# Patient Record
Sex: Male | Born: 1985 | Race: Black or African American | Hispanic: No | Marital: Single | Smoking: Current every day smoker
Health system: Southern US, Community
[De-identification: ages and names within clinical notes are randomized; demographics above are authoritative.]

## PROBLEM LIST (undated history)

## (undated) DIAGNOSIS — F410 Panic disorder [episodic paroxysmal anxiety] without agoraphobia: Secondary | ICD-10-CM

## (undated) DIAGNOSIS — F1410 Cocaine abuse, uncomplicated: Secondary | ICD-10-CM

## (undated) HISTORY — PX: REFRACTIVE SURGERY: SHX103

---
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
## 2006-11-15 ENCOUNTER — Emergency Department: Payer: Self-pay | Admitting: Emergency Medicine

## 2007-01-25 ENCOUNTER — Emergency Department: Payer: Self-pay | Admitting: Emergency Medicine

## 2007-01-31 ENCOUNTER — Emergency Department: Payer: Self-pay | Admitting: Emergency Medicine

## 2007-03-12 ENCOUNTER — Emergency Department: Payer: Self-pay | Admitting: Emergency Medicine

## 2007-10-07 ENCOUNTER — Emergency Department: Payer: Self-pay | Admitting: Emergency Medicine

## 2007-11-05 ENCOUNTER — Inpatient Hospital Stay: Payer: Self-pay | Admitting: Psychiatry

## 2008-03-23 ENCOUNTER — Emergency Department: Payer: Self-pay | Admitting: Internal Medicine

## 2009-11-27 IMAGING — CR LEFT WRIST - COMPLETE 3+ VIEW
1 series · 4 of 4 positions shown · non-contrast
Comparison: none

REASON FOR EXAM: trauma
COMMENTS:

PROCEDURE:     DXR - DXR WRIST LT COMP WITH OBLIQUES  - March 23, 2008 [DATE]
RESULT:     Four views of the LEFT wrist reveal the bones to be adequately
mineralized. There is no evidence of fracture nor of dislocation.

[Series 1: view not recorded · 0.17mm/px · 4 of 4 slices shown]
[im 1/4]
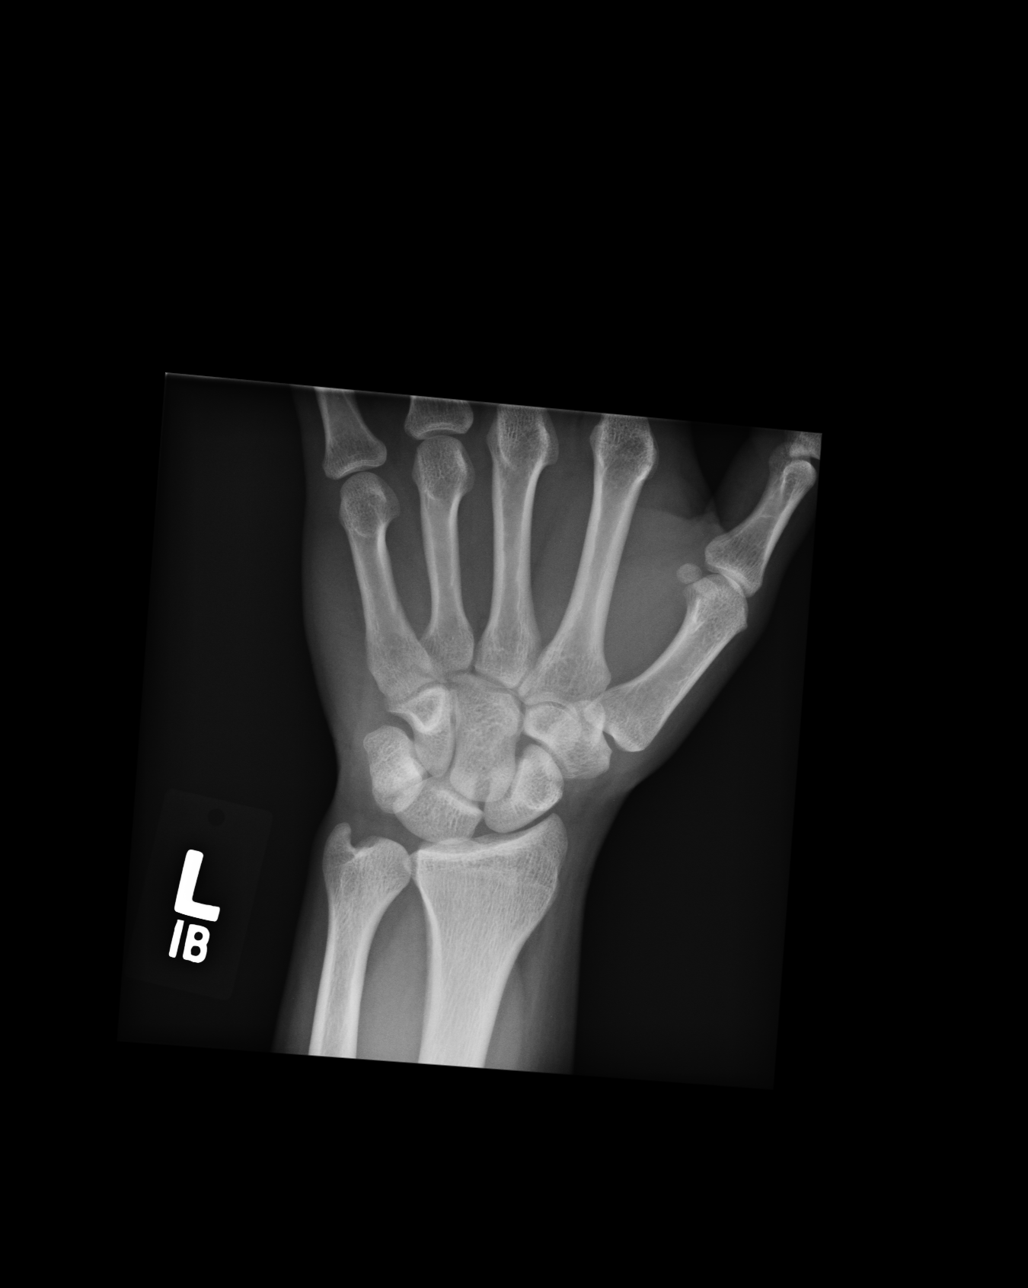
[im 2/4]
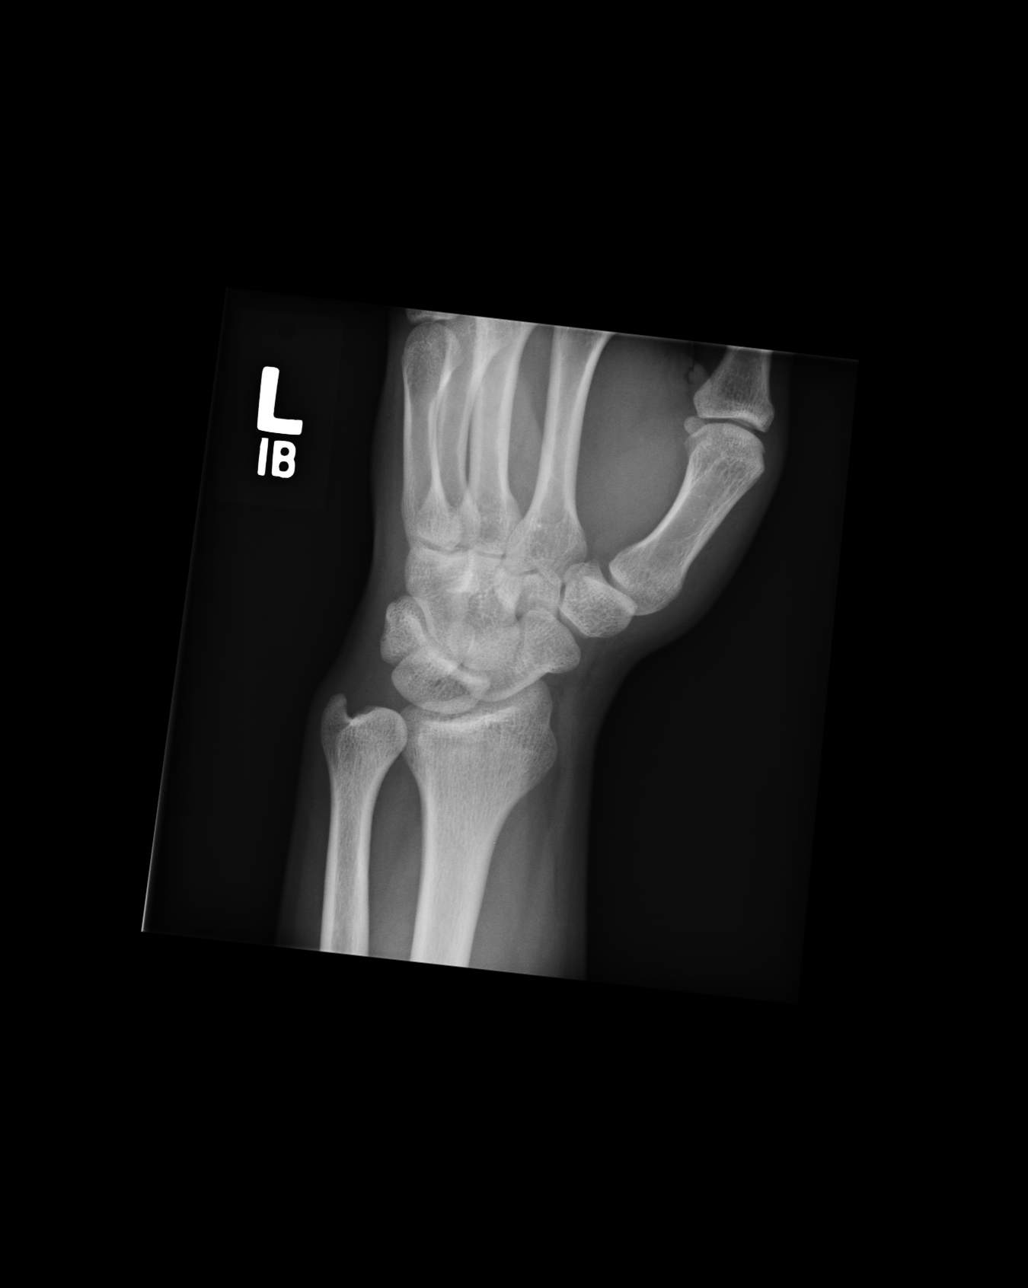
[im 3/4]
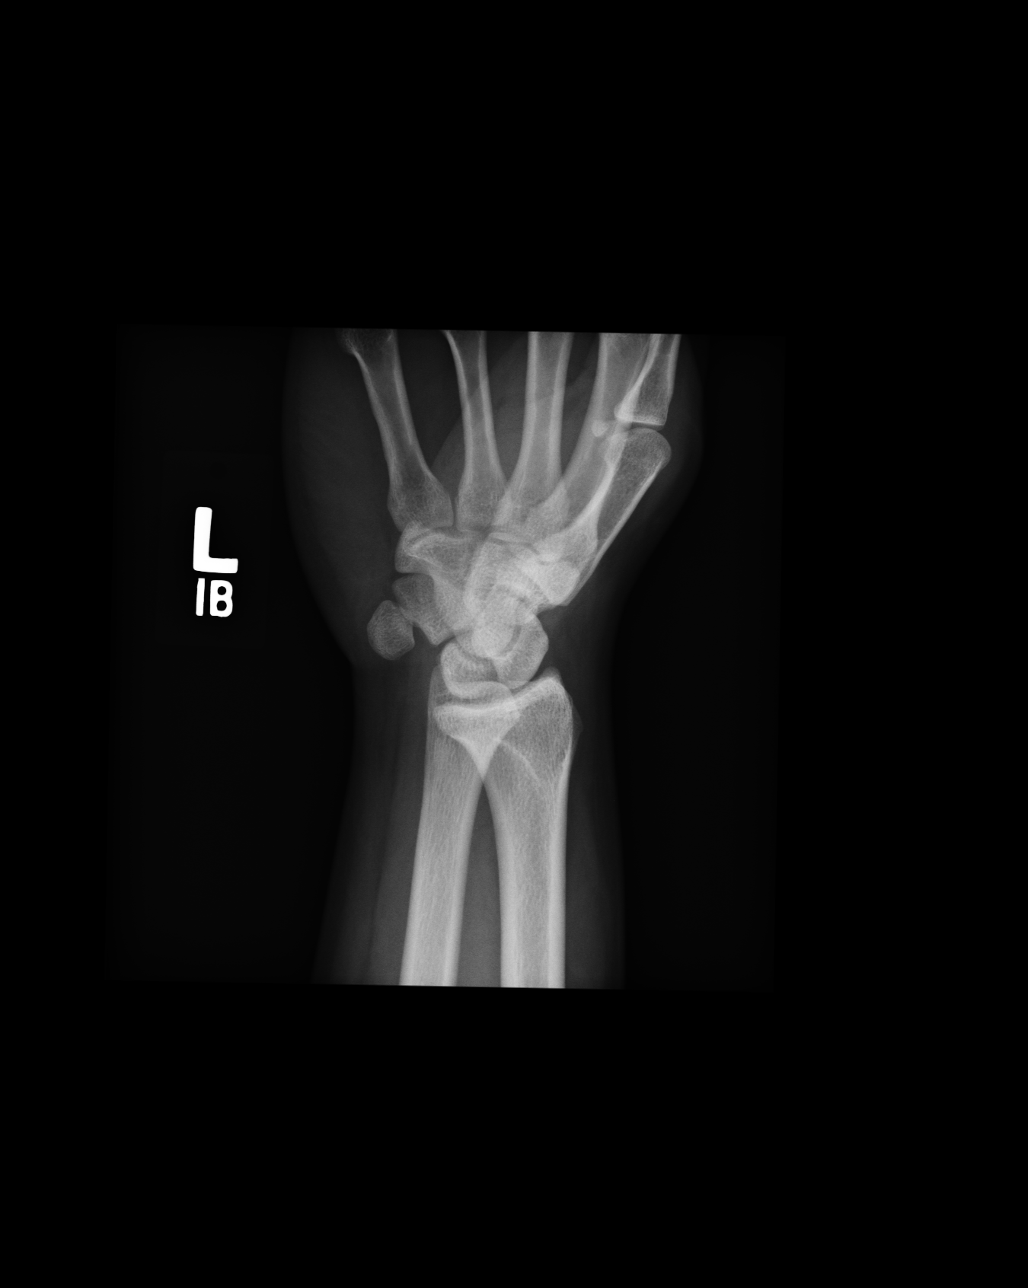
[im 4/4]
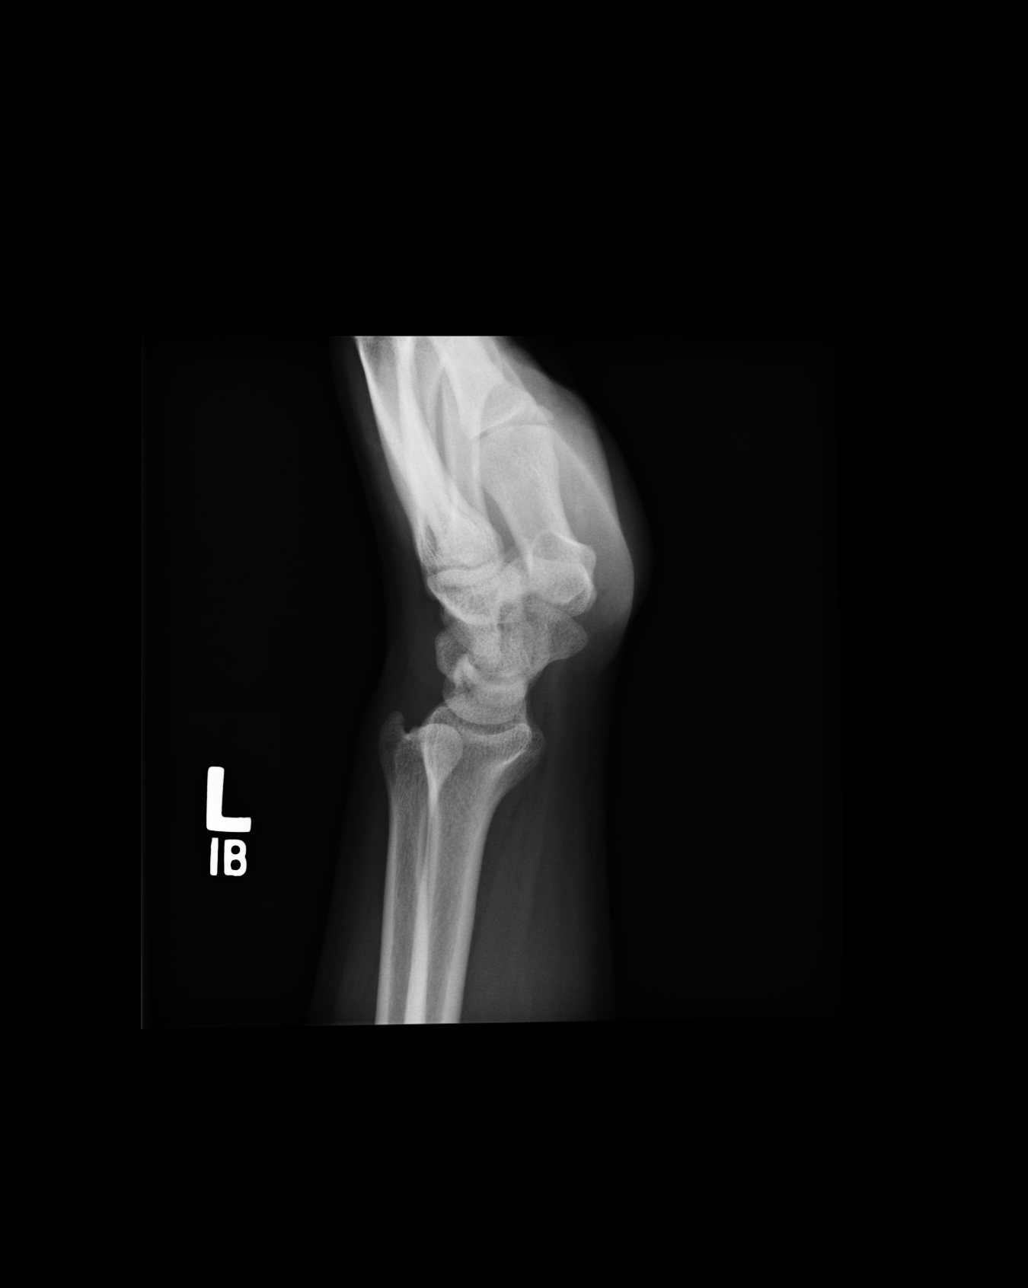

[4 of 4 positions shown; findings below may reference images not displayed]

IMPRESSION: I do not see evidence of acute bony abnormality of the LEFT wrist. Followup
imaging is available if the patient's symptoms warrant further evaluation.

## 2010-06-18 ENCOUNTER — Emergency Department: Payer: Self-pay | Admitting: Emergency Medicine

## 2011-03-19 ENCOUNTER — Emergency Department: Payer: Self-pay | Admitting: Emergency Medicine

## 2012-01-19 ENCOUNTER — Emergency Department: Payer: Self-pay | Admitting: Emergency Medicine

## 2012-01-19 LAB — BASIC METABOLIC PANEL
BUN: 11 mg/dL (ref 7–18)
Calcium, Total: 9.4 mg/dL (ref 8.5–10.1)
Co2: 27 mmol/L (ref 21–32)
Creatinine: 1.16 mg/dL (ref 0.60–1.30)
EGFR (African American): >60
EGFR (Non-African Amer.): >60
Potassium: 4 mmol/L (ref 3.5–5.1)
Sodium: 140 mmol/L (ref 136–145)

## 2012-01-19 LAB — CK: CK, Total: 657 U/L — ABNORMAL HIGH (ref 35–232)

## 2013-02-25 ENCOUNTER — Emergency Department: Payer: Self-pay | Admitting: Emergency Medicine

## 2013-09-08 ENCOUNTER — Emergency Department: Payer: Self-pay | Admitting: Emergency Medicine

## 2013-10-09 ENCOUNTER — Emergency Department: Payer: Self-pay | Admitting: Emergency Medicine

## 2014-05-17 ENCOUNTER — Emergency Department: Payer: Self-pay | Admitting: Emergency Medicine

## 2014-12-23 ENCOUNTER — Emergency Department
Admission: EM | Admit: 2014-12-23 | Discharge: 2014-12-23 | Disposition: A | Discharge status: Home / Self Care | Payer: Medicaid Other | Attending: Emergency Medicine | Admitting: Emergency Medicine

## 2014-12-23 ENCOUNTER — Emergency Department: Payer: Medicaid Other

## 2014-12-23 ENCOUNTER — Encounter: Payer: Self-pay | Admitting: Emergency Medicine

## 2014-12-23 DIAGNOSIS — H66004 Acute suppurative otitis media without spontaneous rupture of ear drum, recurrent, right ear: Secondary | ICD-10-CM | POA: Diagnosis not present

## 2014-12-23 DIAGNOSIS — R079 Chest pain, unspecified: Secondary | ICD-10-CM | POA: Diagnosis not present

## 2014-12-23 DIAGNOSIS — Z720 Tobacco use: Secondary | ICD-10-CM | POA: Diagnosis not present

## 2014-12-23 DIAGNOSIS — H9201 Otalgia, right ear: Secondary | ICD-10-CM | POA: Diagnosis present

## 2014-12-23 LAB — CBC
HCT: 45.4 % (ref 40.0–52.0)
HEMOGLOBIN: 14.8 g/dL (ref 13.0–18.0)
MCH: 28.6 pg (ref 26.0–34.0)
MCHC: 32.6 g/dL (ref 32.0–36.0)
MCV: 87.6 fL (ref 80.0–100.0)
Platelets: 233 10*3/uL (ref 150–440)
RBC: 5.18 MIL/uL (ref 4.40–5.90)
RDW: 14 % (ref 11.5–14.5)
WBC: 8.3 10*3/uL (ref 3.8–10.6)

## 2014-12-23 LAB — TROPONIN I

## 2014-12-23 LAB — BASIC METABOLIC PANEL
ANION GAP: 8 (ref 5–15)
BUN: 12 mg/dL (ref 6–20)
CALCIUM: 9.9 mg/dL (ref 8.9–10.3)
CO2: 26 mmol/L (ref 22–32)
CREATININE: 1.29 mg/dL — AB (ref 0.61–1.24)
Chloride: 106 mmol/L (ref 101–111)
GFR calc Af Amer: >60 mL/min (ref >60)
GFR calc non Af Amer: >60 mL/min (ref >60)
GLUCOSE: 119 mg/dL — AB (ref 65–99)
Potassium: 3.4 mmol/L — ABNORMAL LOW (ref 3.5–5.1)
Sodium: 140 mmol/L (ref 135–145)

## 2014-12-23 MED ORDER — IBUPROFEN 600 MG PO TABS
ORAL_TABLET | ORAL | Status: AC
Start: 2014-12-23 — End: 2014-12-23
  Administered 2014-12-23: 600 mg via ORAL
  Filled 2014-12-23: qty 1

## 2014-12-23 MED ORDER — IBUPROFEN 200 MG PO TABS: 600.0000 mg | ORAL_TABLET | Freq: Four times a day (QID) | ORAL | Status: DC | PRN

## 2014-12-23 MED ORDER — AZITHROMYCIN 250 MG PO TABS: ORAL_TABLET | ORAL | Status: DC

## 2014-12-23 MED ORDER — IBUPROFEN 400 MG PO TABS
600.0000 mg | ORAL_TABLET | Freq: Once | ORAL | Status: AC
  Administered 2014-12-23: 600 mg via ORAL

## 2014-12-23 MED ORDER — DEXAMETHASONE 6 MG PO TABS
6.0000 mg | ORAL_TABLET | Freq: Once | ORAL | Status: AC
  Administered 2014-12-23: 6 mg via ORAL
  Filled 2014-12-23: qty 1

## 2014-12-23 NOTE — ED Provider Notes (Signed)
[facility] Emergency Department Provider Note  ____________________________________________  Time seen: 6:10 PM  I have reviewed the triage vital signs and the nursing notes.   HISTORY  Chief Complaint Chest Pain and Otalgia    HPI Bryan Walters is a 29 y.o. male who complains of right ear pain and decreased hearing for the past 3 days. Gradual onset and constant. He notes that this happens about once a month and lasts about 5 days. It is associated with pain in the right ear. He also has some sore throat. Denies any swollen glands. No shortness of breath or cough. He has had some runny nose as well. He reported some chest pain in the waiting room, which is now resolved. Was not exertional nor pleuritic. Not radiating or associated with nausea vomiting diaphoresis or shortness of breath.     History reviewed. No pertinent past medical history.   There are no active problems to display for this patient.    History reviewed. No pertinent past surgical history.   Current Outpatient Rx  Name  Route  Sig  Dispense  Refill  . azithromycin (ZITHROMAX Z-PAK) 250 MG tablet      Take 2 tablets (500 mg) on  Day 1,  followed by 1 tablet (250 mg) once daily on Days 2 through 5.   6 each   0   . ibuprofen (MOTRIN IB) 200 MG tablet   Oral   Take 3 tablets (600 mg total) by mouth every 6 (six) hours as needed.   60 tablet   0      Allergies Review of patient's allergies indicates no known allergies.   History reviewed. No pertinent family history.  Social History Social History  Substance Use Topics  . Smoking status: Current Every Day Smoker  . Smokeless tobacco: None  . Alcohol Use: No    Review of Systems  Constitutional:   No fever or chills. No weight changes Eyes:   No blurry vision or double vision.  ENT:   No sore throat.positive right earache and decreased hearing on the right Cardiovascular:   Chest pain as above  briefly Respiratory:   No dyspnea or cough. Gastrointestinal:   Negative for abdominal pain, vomiting and diarrhea.  No BRBPR or melena. Genitourinary:   Negative for dysuria, urinary retention, bloody urine, or difficulty urinating. Musculoskeletal:   Negative for back pain. No joint swelling or pain. Skin:   Negative for rash. Neurological:   Negative for headaches, focal weakness or numbness. Psychiatric:  No anxiety or depression.   Endocrine:  No hot/cold intolerance, changes in energy, or sleep difficulty.  10-point ROS otherwise negative.  ____________________________________________   PHYSICAL EXAM:  VITAL SIGNS: ED Triage Vitals  Enc Vitals Group     BP 12/23/14 1404 122/81 mmHg     Pulse Rate 12/23/14 1404 61     Resp 12/23/14 1404 22     Temp 12/23/14 1404 99.1 F (37.3 C)     Temp Source 12/23/14 1404 Oral     SpO2 12/23/14 1404 99 %     Weight 12/23/14 1404 185 lb (83.915 kg)     Height 12/23/14 1404  (1.676 m)     Head Cir --      Peak Flow --      Pain Score 12/23/14 1412 8     Pain Loc --      Pain Edu? --      Excl. in GC? --  Constitutional:   Alert and oriented. Well appearing and in no distress. Eyes:   No scleral icterus. No conjunctival pallor. PERRL. EOMI ENT   Head:   Normocephalic and atraumatic.left TM normal. Right TM inflamed.no mastoid tenderness   Nose:   Positive congestion. No septal hematoma   Mouth/Throat:   MMM, moderatepharyngeal erythema. No peritonsillar mass. No uvula shift.normal voice, managing secretions   Neck:   No stridor. No SubQ emphysema. No meningismus. Hematological/Lymphatic/Immunilogical:   No cervical lymphadenopathy. Cardiovascular:   RRR. Normal and symmetric distal pulses are present in all extremities. No murmurs, rubs, or gallops. Respiratory:   Normal respiratory effort without tachypnea nor retractions. Breath sounds are clear and equal bilaterally. No  wheezes/rales/rhonchi. Gastrointestinal:   Soft and nontender. No distention. There is no CVA tenderness.  No rebound, rigidity, or guarding. Genitourinary:   deferred Musculoskeletal:   Nontender with normal range of motion in all extremities. No joint effusions.  No lower extremity tenderness.  No edema. Neurologic:   Normal speech and language.  CN 2-10 normal. Motor grossly intact. No pronator drift.  Normal gait. No gross focal neurologic deficits are appreciated.  Skin:    Skin is warm, dry and intact. No rash noted.  No petechiae, purpura, or bullae. Psychiatric:   Mood and affect are normal. Speech and behavior are normal. Patient exhibits appropriate insight and judgment.  ____________________________________________    LABS (pertinent positives/negatives) (all labs ordered are listed, but only abnormal results are displayed) Labs Reviewed  BASIC METABOLIC PANEL - Abnormal; Notable for the following:    Potassium 3.4 (*)    Glucose, Bld 119 (*)    Creatinine, Ser 1.29 (*)    All other components within normal limits  CBC  TROPONIN I   ____________________________________________   EKG  Interpreted by me Normal sinus rhythm rate of 64, normal axis and intervals. Incomplete right bundle branch block. Normal ST segments and T waves.  ____________________________________________    RADIOLOGY  Chest x-ray unremarkable  ____________________________________________   PROCEDURES   ____________________________________________   INITIAL IMPRESSION / ASSESSMENT AND PLAN / ED COURSE  Pertinent labs & imaging results that were available during my care of the patient were reviewed by me and considered in my medical decision making (see chart for details).  Labs unremarkable. Patient presents with a right otitis media without any other concerning features. The chest pain he experiences atypical and he feels it was likely related to anxiety due to his worsening ear  pain. Low suspicion for any intracranial abscess or CNS involvement. It is not malignant or involving the soft tissues or the orbit. We'll have him take a trial of Decadron and NSAIDs, and if the symptoms do not improve in the next 3 days we'll have him start taking azithromycin.     ____________________________________________   FINAL CLINICAL IMPRESSION(S) / ED DIAGNOSES  Final diagnoses:  Recurrent acute suppurative otitis media of right ear without spontaneous rupture of tympanic membrane      Sharman Cheek, MD 12/23/14 1819

## 2014-12-23 NOTE — Discharge Instructions (Signed)
You were seen in the ER today for an ear infection. We gave you a steroid medication to help with this. Take ibuprofen every 6 hours over the next few days as well. You do not need to fill the prescription for azithromycin right away. However, if your symptoms have not improved 3 days from now, you should fill the prescription and start taking azithromycin as directed at that time.  Otitis Media Otitis media is redness, soreness, and inflammation of the middle ear. Otitis media may be caused by allergies or, most commonly, by infection. Often it occurs as a complication of the common cold. SIGNS AND SYMPTOMS Symptoms of otitis media may include:  Earache.  Fever.  Ringing in your ear.  Headache.  Leakage of fluid from the ear. DIAGNOSIS To diagnose otitis media, your health care provider will examine your ear with an otoscope. This is an instrument that allows your health care provider to see into your ear in order to examine your eardrum. Your health care provider also will ask you questions about your symptoms. TREATMENT  Typically, otitis media resolves on its own within 3-5 days. Your health care provider may prescribe medicine to ease your symptoms of pain. If otitis media does not resolve within 5 days or is recurrent, your health care provider may prescribe antibiotic medicines if he or she suspects that a bacterial infection is the cause. HOME CARE INSTRUCTIONS   If you were prescribed an antibiotic medicine, finish it all even if you start to feel better.  Take medicines only as directed by your health care provider.  Keep all follow-up visits as directed by your health care provider. SEEK MEDICAL CARE IF:  You have otitis media only in one ear, or bleeding from your nose, or both.  You notice a lump on your neck.  You are not getting better in 3-5 days.  You feel worse instead of better. SEEK IMMEDIATE MEDICAL CARE IF:   You have pain that is not controlled with  medicine.  You have swelling, redness, or pain around your ear or stiffness in your neck.  You notice that part of your face is paralyzed.  You notice that the bone behind your ear (mastoid) is tender when you touch it. MAKE SURE YOU:   Understand these instructions.  Will watch your condition.  Will get help right away if you are not doing well or get worse. Document Released: 12/30/2003 Document Revised: 08/10/2013 Document Reviewed: 10/21/2012 [facility] Patient Information 2015 [place], [place]. This information is not intended to replace advice given to you by your health care provider. Make sure you discuss any questions you have with your health care provider.

## 2014-12-23 NOTE — ED Notes (Signed)
Pt states ear pain and fullness for 2 days, states his chest feels tight but believes its from the anxiety of not being able to hear well, states some nasal drainage and cough, denies any fevers

## 2014-12-23 NOTE — ED Notes (Signed)
Pt to ed with c/o chest pain this am.  Pt states sharp and shooting pain in chest with right side of head pain and earache this am.  Denies sob, does report cough and congestion, appears in nad at triage at this time.

## 2015-05-15 IMAGING — CR DG SHOULDER 3+V*R*
1 series · 3 of 3 positions shown · non-contrast
Comparison: None.

CLINICAL DATA: Right shoulder pain following injury

EXAM:
DG SHOULDER 3+ VIEWS RIGHT

[Series 1: internal rotate · 0.17mm/px · 3 of 3 slices shown]
[im 1/3]
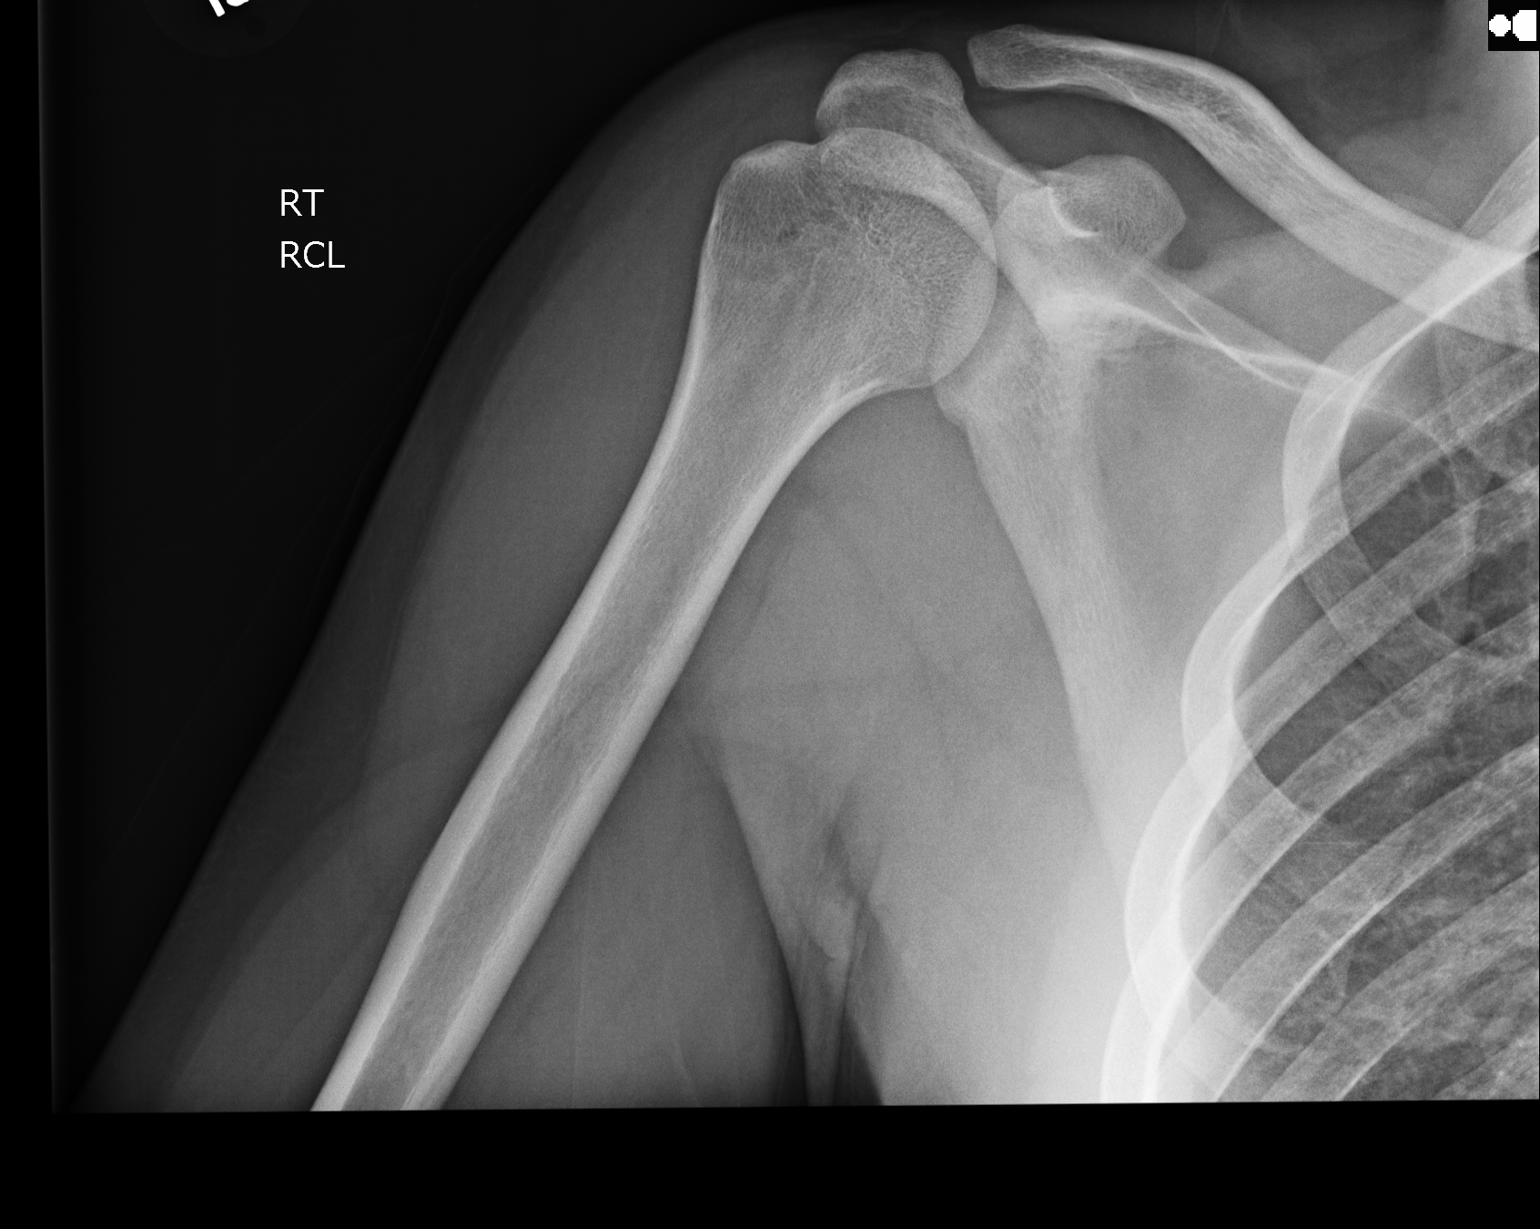
[im 2/3]
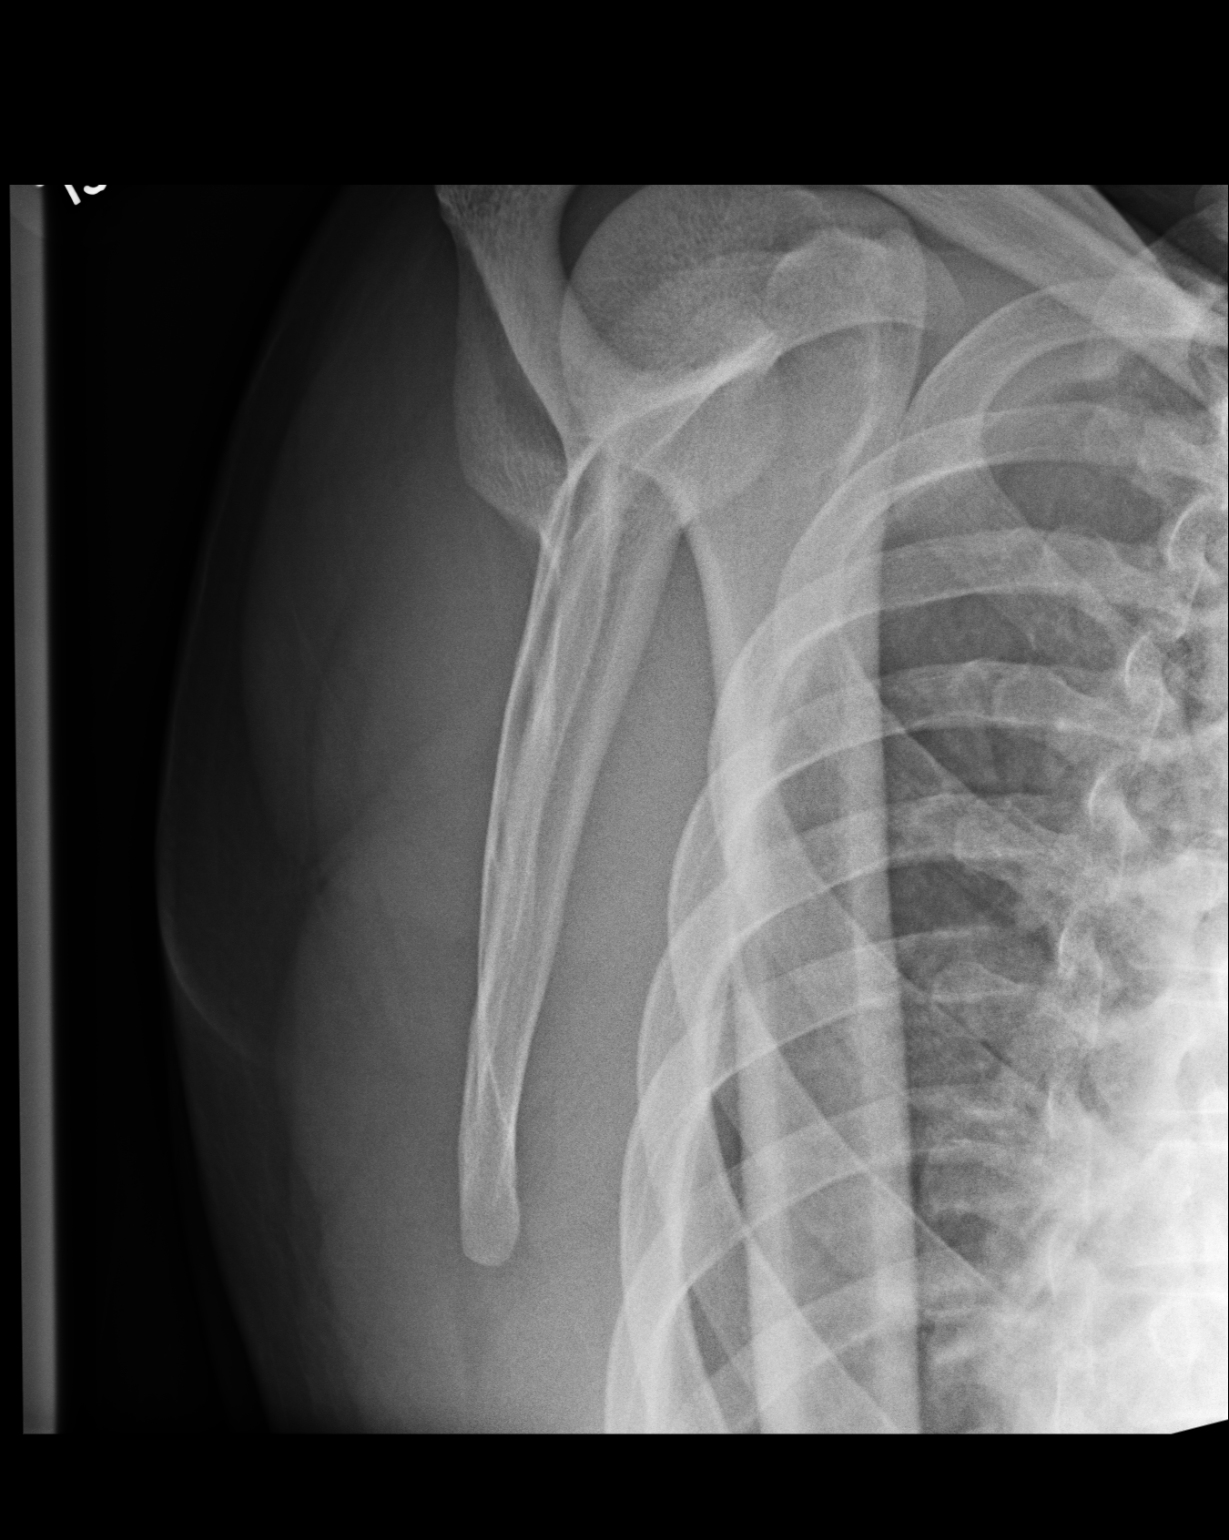
[im 3/3]
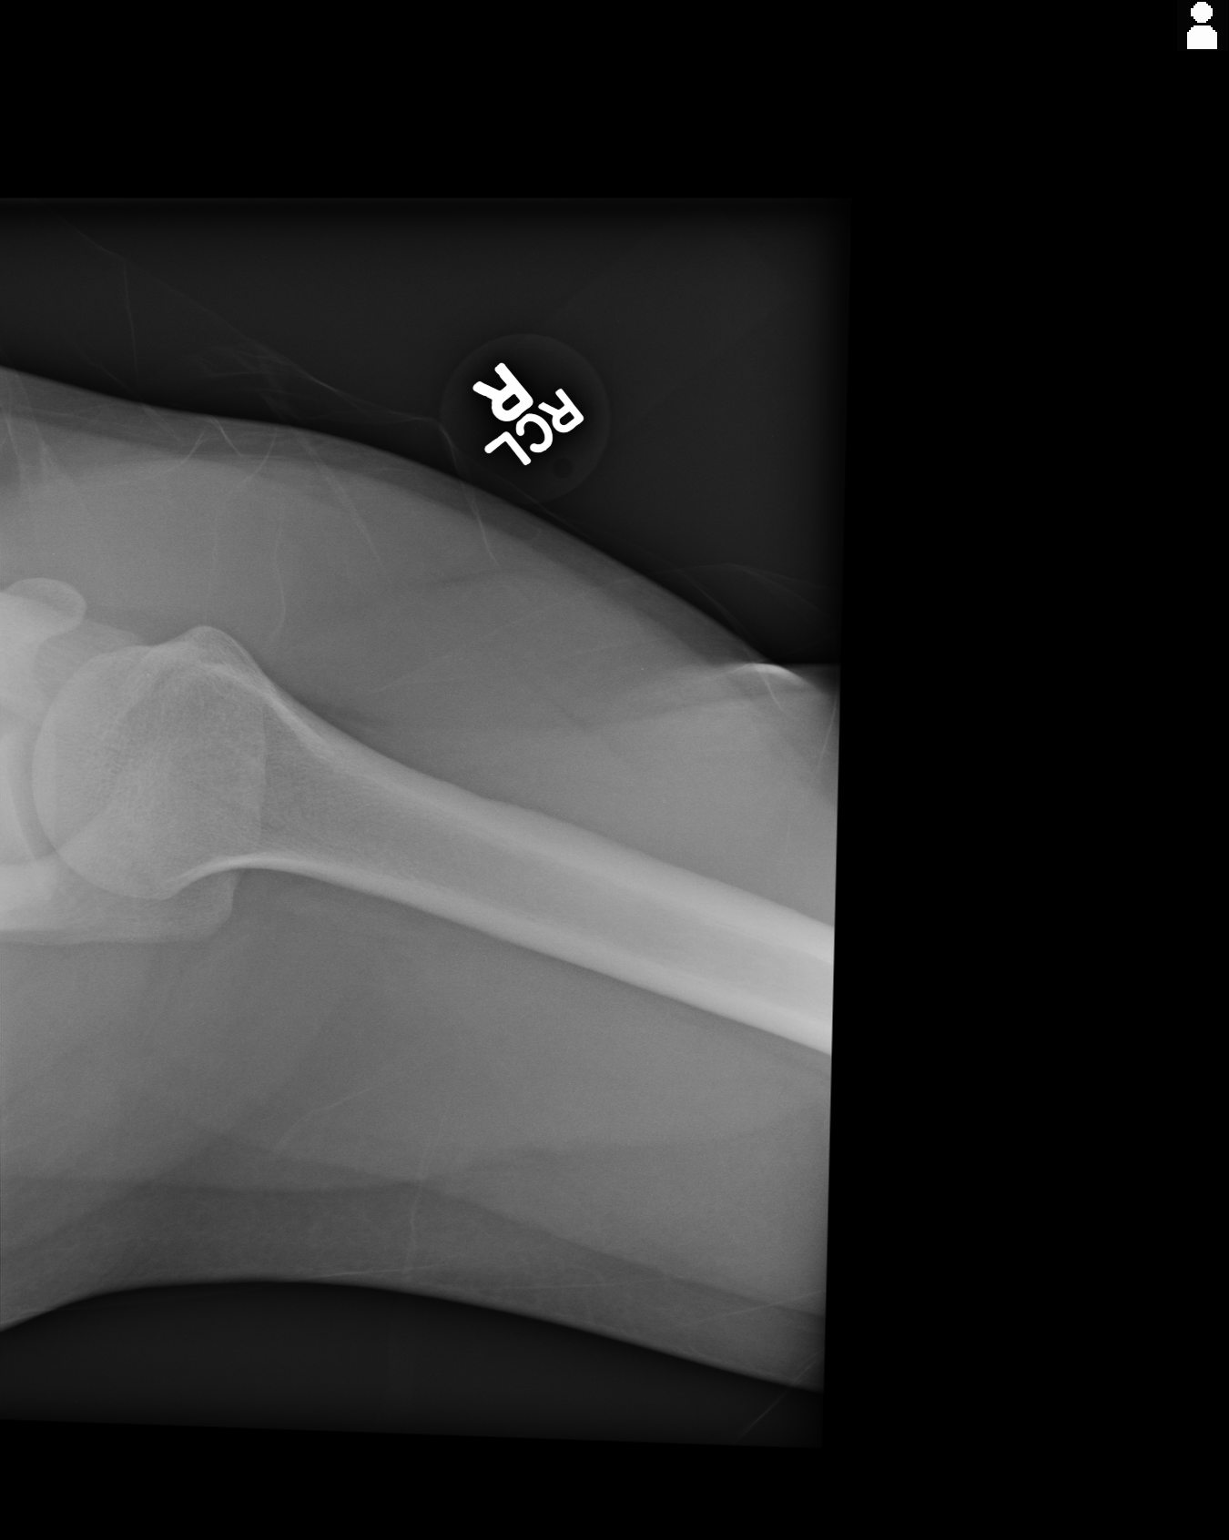

[3 of 3 positions shown; findings below may reference images not displayed]

FINDINGS: There is no evidence of fracture or dislocation. There is no
evidence of arthropathy or other focal bone abnormality. Soft
tissues are unremarkable.
IMPRESSION: No acute abnormality noted.

## 2015-06-04 ENCOUNTER — Emergency Department
Admission: EM | Admit: 2015-06-04 | Discharge: 2015-06-04 | Disposition: A | Discharge status: Home / Self Care | Payer: Medicaid Other | Attending: Emergency Medicine | Admitting: Emergency Medicine

## 2015-06-04 DIAGNOSIS — Y9289 Other specified places as the place of occurrence of the external cause: Secondary | ICD-10-CM | POA: Insufficient documentation

## 2015-06-04 DIAGNOSIS — S39011A Strain of muscle, fascia and tendon of abdomen, initial encounter: Secondary | ICD-10-CM | POA: Diagnosis not present

## 2015-06-04 DIAGNOSIS — F17200 Nicotine dependence, unspecified, uncomplicated: Secondary | ICD-10-CM | POA: Diagnosis not present

## 2015-06-04 DIAGNOSIS — T796XXA Traumatic ischemia of muscle, initial encounter: Secondary | ICD-10-CM | POA: Diagnosis not present

## 2015-06-04 DIAGNOSIS — Y998 Other external cause status: Secondary | ICD-10-CM | POA: Diagnosis not present

## 2015-06-04 DIAGNOSIS — S3991XA Unspecified injury of abdomen, initial encounter: Secondary | ICD-10-CM | POA: Diagnosis present

## 2015-06-04 DIAGNOSIS — Y93B2 Activity, push-ups, pull-ups, sit-ups: Secondary | ICD-10-CM | POA: Insufficient documentation

## 2015-06-04 DIAGNOSIS — X58XXXA Exposure to other specified factors, initial encounter: Secondary | ICD-10-CM | POA: Insufficient documentation

## 2015-06-04 LAB — COMPREHENSIVE METABOLIC PANEL
ALT: 31 U/L (ref 17–63)
ANION GAP: 10 (ref 5–15)
AST: 52 U/L — ABNORMAL HIGH (ref 15–41)
Albumin: 4.7 g/dL (ref 3.5–5.0)
Alkaline Phosphatase: 67 U/L (ref 38–126)
BILIRUBIN TOTAL: 1.1 mg/dL (ref 0.3–1.2)
BUN: 20 mg/dL (ref 6–20)
CO2: 19 mmol/L — ABNORMAL LOW (ref 22–32)
Calcium: 9.4 mg/dL (ref 8.9–10.3)
Chloride: 106 mmol/L (ref 101–111)
Creatinine, Ser: 1.6 mg/dL — ABNORMAL HIGH (ref 0.61–1.24)
GFR, EST NON AFRICAN AMERICAN: 57 mL/min — AB (ref >60)
Glucose, Bld: 96 mg/dL (ref 65–99)
POTASSIUM: 3.4 mmol/L — AB (ref 3.5–5.1)
Sodium: 135 mmol/L (ref 135–145)
TOTAL PROTEIN: 7.8 g/dL (ref 6.5–8.1)

## 2015-06-04 LAB — CBC
HEMATOCRIT: 44.6 % (ref 40.0–52.0)
Hemoglobin: 14.8 g/dL (ref 13.0–18.0)
MCH: 29 pg (ref 26.0–34.0)
MCHC: 33.2 g/dL (ref 32.0–36.0)
MCV: 87.6 fL (ref 80.0–100.0)
Platelets: 248 10*3/uL (ref 150–440)
RBC: 5.09 MIL/uL (ref 4.40–5.90)
RDW: 14.5 % (ref 11.5–14.5)
WBC: 9.4 10*3/uL (ref 3.8–10.6)

## 2015-06-04 LAB — CK: Total CK: 916 U/L — ABNORMAL HIGH (ref 49–397)

## 2015-06-04 LAB — LIPASE, BLOOD: LIPASE: 57 U/L — AB (ref 11–51)

## 2015-06-04 MED ORDER — IBUPROFEN 800 MG PO TABS
800.0000 mg | ORAL_TABLET | ORAL | Status: AC
  Administered 2015-06-04: 800 mg via ORAL
  Filled 2015-06-04: qty 1

## 2015-06-04 MED ORDER — ACETAMINOPHEN 500 MG PO TABS
1000.0000 mg | ORAL_TABLET | ORAL | Status: AC
  Administered 2015-06-04: 1000 mg via ORAL
  Filled 2015-06-04: qty 2

## 2015-06-04 MED ORDER — SODIUM CHLORIDE 0.9 % IV BOLUS (SEPSIS)
1000.0000 mL | Freq: Once | INTRAVENOUS | Status: AC
Start: 2015-06-04 — End: 2015-06-04
  Administered 2015-06-04: 1000 mL via INTRAVENOUS

## 2015-06-04 NOTE — ED Notes (Signed)
Pt C/O sharp chest pain. EDP made aware and EKG completed

## 2015-06-04 NOTE — Discharge Instructions (Signed)
You were seen in the emergency room for abdominal pain. It is important that you follow up closely with your primary care doctor in the next couple of days.  If you're unable to see her primary care doctor you may return to the emergency room or go to the [facility] walk-in clinic in 1 or 2 days for reexam.  Please return to the emergency room right away if you are to develop a fever, dark urine or trouble urinating, severe nausea, your pain becomes severe or worsens, you are unable to keep food down, begin vomiting any dark or bloody fluid, you develop any dark or bloody stools, feel dehydrated, or other new concerns or symptoms arise.   Muscle Strain A muscle strain is an injury that occurs when a muscle is stretched beyond its normal length. Usually a small number of muscle fibers are torn when this happens. Muscle strain is rated in degrees. First-degree strains have the least amount of muscle fiber tearing and pain. Second-degree and third-degree strains have increasingly more tearing and pain.  Usually, recovery from muscle strain takes 1-2 weeks. Complete healing takes 5-6 weeks.  CAUSES  Muscle strain happens when a sudden, violent force placed on a muscle stretches it too far. This may occur with lifting, sports, or a fall.  RISK FACTORS Muscle strain is especially common in athletes.  SIGNS AND SYMPTOMS At the site of the muscle strain, there may be:  Pain.  Bruising.  Swelling.  Difficulty using the muscle due to pain or lack of normal function. DIAGNOSIS  Your health care provider will perform a physical exam and ask about your medical history. TREATMENT  Often, the best treatment for a muscle strain is resting, icing, and applying cold compresses to the injured area.  HOME CARE INSTRUCTIONS   Use the PRICE method of treatment to promote muscle healing during the first 2-3 days after your injury. The PRICE method involves:  Protecting the muscle from being injured  again.  Restricting your activity and resting the injured body part.  Icing your injury. To do this, put ice in a plastic bag. Place a towel between your skin and the bag. Then, apply the ice and leave it on from 15-20 minutes each hour. After the third day, switch to moist heat packs.  Apply compression to the injured area with a splint or elastic bandage. Be careful not to wrap it too tightly. This may interfere with blood circulation or increase swelling.  Elevate the injured body part above the level of your heart as often as you can.  Only take over-the-counter or prescription medicines for pain, discomfort, or fever as directed by your health care provider.  Warming up prior to exercise helps to prevent future muscle strains. SEEK MEDICAL CARE IF:   You have increasing pain or swelling in the injured area.  You have numbness, tingling, or a significant loss of strength in the injured area. MAKE SURE YOU:   Understand these instructions.  Will watch your condition.  Will get help right away if you are not doing well or get worse.   This information is not intended to replace advice given to you by your health care provider. Make sure you discuss any questions you have with your health care provider.   Document Released: 03/26/2005 Document Revised: 01/14/2013 Document Reviewed: 10/23/2012 Elsevier Interactive Patient Education [employer]

## 2015-06-04 NOTE — ED Notes (Signed)
Pt requesting more pain medication, EDP made aware 

## 2015-06-04 NOTE — ED Notes (Signed)
Pt from home via EMS, C/O abd pain shooting upward. Reports " i did 100 sit ups the other day and think i tore something"

## 2015-06-04 NOTE — ED Provider Notes (Signed)
[facility] Emergency Department Provider Note  ____________________________________________  Time seen: Approximately 6:44 PM  I have reviewed the triage vital signs and the nursing notes.   HISTORY  Chief Complaint Abdominal Pain    HPI Bryan Walters is a 30 y.o. male history of ADHD. Patient presents today states that he did 100 situps 3 days ago as he wanted to get back into shape, just afterwards he started having achy pain over the left side of the abdomen. Reports that he is fine when he lays still but whenever he goes to sit up as if he is to endocrine she has moderate pain over the left side of the muscles in his abdomen. No fevers chills nausea vomiting diarrhea. Denies pain on the right side. No back pain.  Feels like he "tore" and abdominal muscle. No pain in the groin or testicles.   History reviewed. No pertinent past medical history.  There are no active problems to display for this patient.   History reviewed. No pertinent past surgical history.  Current Outpatient Rx  Name  Route  Sig  Dispense  Refill  . azithromycin (ZITHROMAX Z-PAK) 250 MG tablet      Take 2 tablets (500 mg) on  Day 1,  followed by 1 tablet (250 mg) once daily on Days 2 through 5.   6 each   0   . ibuprofen (MOTRIN IB) 200 MG tablet   Oral   Take 3 tablets (600 mg total) by mouth every 6 (six) hours as needed.   60 tablet   0     Allergies Review of patient's allergies indicates no known allergies.  No family history on file.  Social History Social History  Substance Use Topics  . Smoking status: Current Every Day Smoker  . Smokeless tobacco: None  . Alcohol Use: No    Review of Systems Constitutional: No fever/chills Eyes: No visual changes. ENT: No sore throat. Cardiovascular: Denies chest pain. Respiratory: Denies shortness of breath. Gastrointestinal: No nausea, no vomiting.  No diarrhea.  No constipation. Genitourinary: Negative for  dysuria. Musculoskeletal: Negative for back pain. Skin: Negative for rash. Neurological: Negative for headaches, focal weakness or numbness.  10-point ROS otherwise negative.  ____________________________________________   PHYSICAL EXAM:  VITAL SIGNS: ED Triage Vitals  Enc Vitals Group     BP 06/04/15 1830 135/81 mmHg     Pulse Rate 06/04/15 1830 65     Resp --      Temp 06/04/15 1830 98.6 F (37 C)     Temp src --      SpO2 06/04/15 1830 99 %     Weight 06/04/15 1830 185 lb (83.915 kg)     Height 06/04/15 1830  (1.676 m)     Head Cir --      Peak Flow --      Pain Score 06/04/15 1830 10     Pain Loc --      Pain Edu? --      Excl. in GC? --    Constitutional: Alert and oriented. Well appearing and in no acute distress. Eyes: Conjunctivae are normal. PERRL. EOMI. Head: Atraumatic. Nose: No congestion/rhinnorhea. Mouth/Throat: Mucous membranes are moist.  Oropharynx non-erythematous. Neck: No stridor.   Cardiovascular: Normal rate, regular rhythm. Grossly normal heart sounds.  Good peripheral circulation. Respiratory: Normal respiratory effort.  No retractions. Lungs CTAB. Gastrointestinal: Soft and nontender except for mild tenderness along the left anterior abdominal wall without edema or bruising.  No pain at McBurney's point. No rebound or guarding. No signs of peritonitis.. No distention. No groin pain or mass. No CVA tenderness. Musculoskeletal: No lower extremity tenderness nor edema.  No tenderness in the calf muscles. No tenderness the upper arms. Neurologic:  Normal speech and language. No gross focal neurologic deficits are appreciated. Skin:  Skin is warm, dry and intact. No rash noted. Psychiatric: Mood and affect are normal. Speech and behavior are normal.  ____________________________________________   LABS (all labs ordered are listed, but only abnormal results are displayed)  Labs Reviewed  COMPREHENSIVE METABOLIC PANEL - Abnormal; Notable for the  following:    Potassium 3.4 (*)    CO2 19 (*)    Creatinine, Ser 1.60 (*)    AST 52 (*)    GFR calc non Af Amer 57 (*)    All other components within normal limits  LIPASE, BLOOD - Abnormal; Notable for the following:    Lipase 57 (*)    All other components within normal limits  CK - Abnormal; Notable for the following:    Total CK 916 (*)    All other components within normal limits  CBC   ____________________________________________  EKG The patient reported approximately 8 PM that he is experiencing a slight tapping feeling over the left upper abdomen and chest wall. Denies any chest pain or pressure. No shortness of breath nausea vomiting or sweating. He has no significant risk factor for coronary disease other than current smoker.  EKG reviewed and to room me at 2020 Normal sinus rhythm, heart rate 60 QTc 420 QRS 100 Normal sinus rhythm, no evidence of acute ischemic abnormality. Slight possible incomplete right bundle-branch block. Compared with previous EKG from 09/16/2016changes noted. ____________________________________________  RADIOLOGY  Given clinical history and exam, no evidence of peritonitis or surgical signs. Seems most likely the patient has strained his abdominal musculature from recent sit ups. No right-sided abdominal pain. Afebrile, normal white count. Labs are reassuring except for a slightly elevated CK, likely consistent with muscle strain or tear.  No pain at McBurney's point. Negative Murphy. No nausea vomiting or diarrhea. Afebrile.  ----------------------------------------- 7:17 PM on 06/04/2015 -----------------------------------------  Patient reports improvement after appropriate and Tylenol. Due to slightly elevated CK, I will give a liter of normal saline. Plan to likely discharge the patient home with abdominal pain or precautions thereafter.     Return precautions and treatment recommendations and follow-up discussed with the patient  who is agreeable with the plan. Careful abdominal pain precautions and treatment recommendations advised to the patient is agreeable with the plan.  ____________________________________________   PROCEDURES  Procedure(s) performed: None  Critical Care performed: No  ____________________________________________   INITIAL IMPRESSION / ASSESSMENT AND PLAN / ED COURSE  Pertinent labs & imaging results that were available during my care of the patient were reviewed by me and considered in my medical decision making (see chart for details).  ____________________________________________   FINAL CLINICAL IMPRESSION(S) / ED DIAGNOSES  Final diagnoses:  Traumatic rhabdomyolysis, initial encounter (HCC)  Abdominal muscle strain, initial encounter      Sharyn Creamer, MD 06/04/15 2025

## 2016-01-21 IMAGING — CR DG FOOT COMPLETE 3+V*L*
1 series · 3 of 3 positions shown · non-contrast
Comparison: None.

CLINICAL DATA: Patient stubbed of the big toe 3 days ago and pain
continues to get worse.

EXAM:
LEFT FOOT - COMPLETE 3+ VIEW

[Series 4: x foot ap left · 0.14mm/px · 3 of 3 slices shown]
[im 1/3]
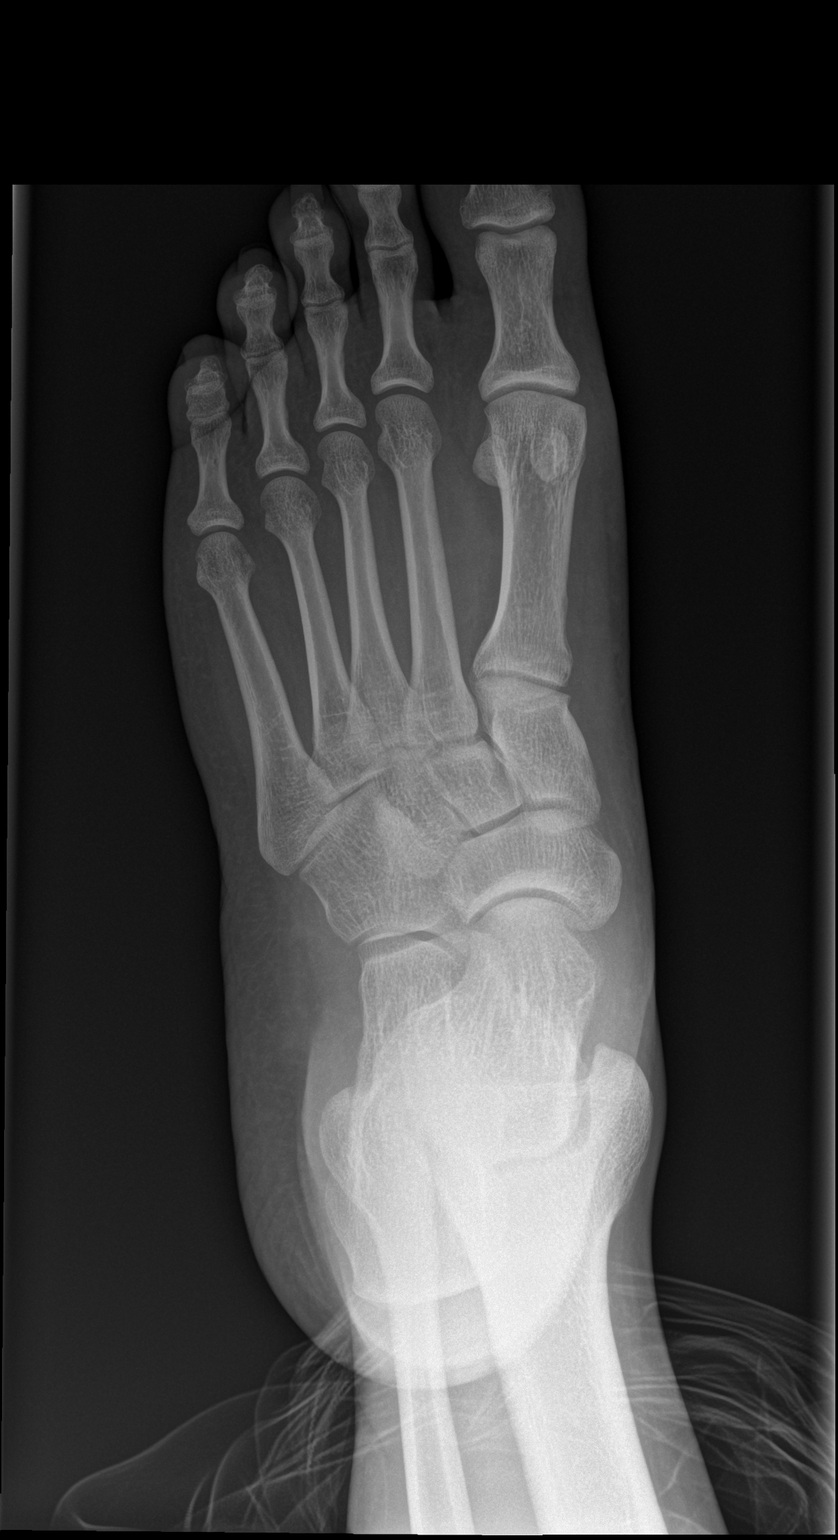
[im 2/3]
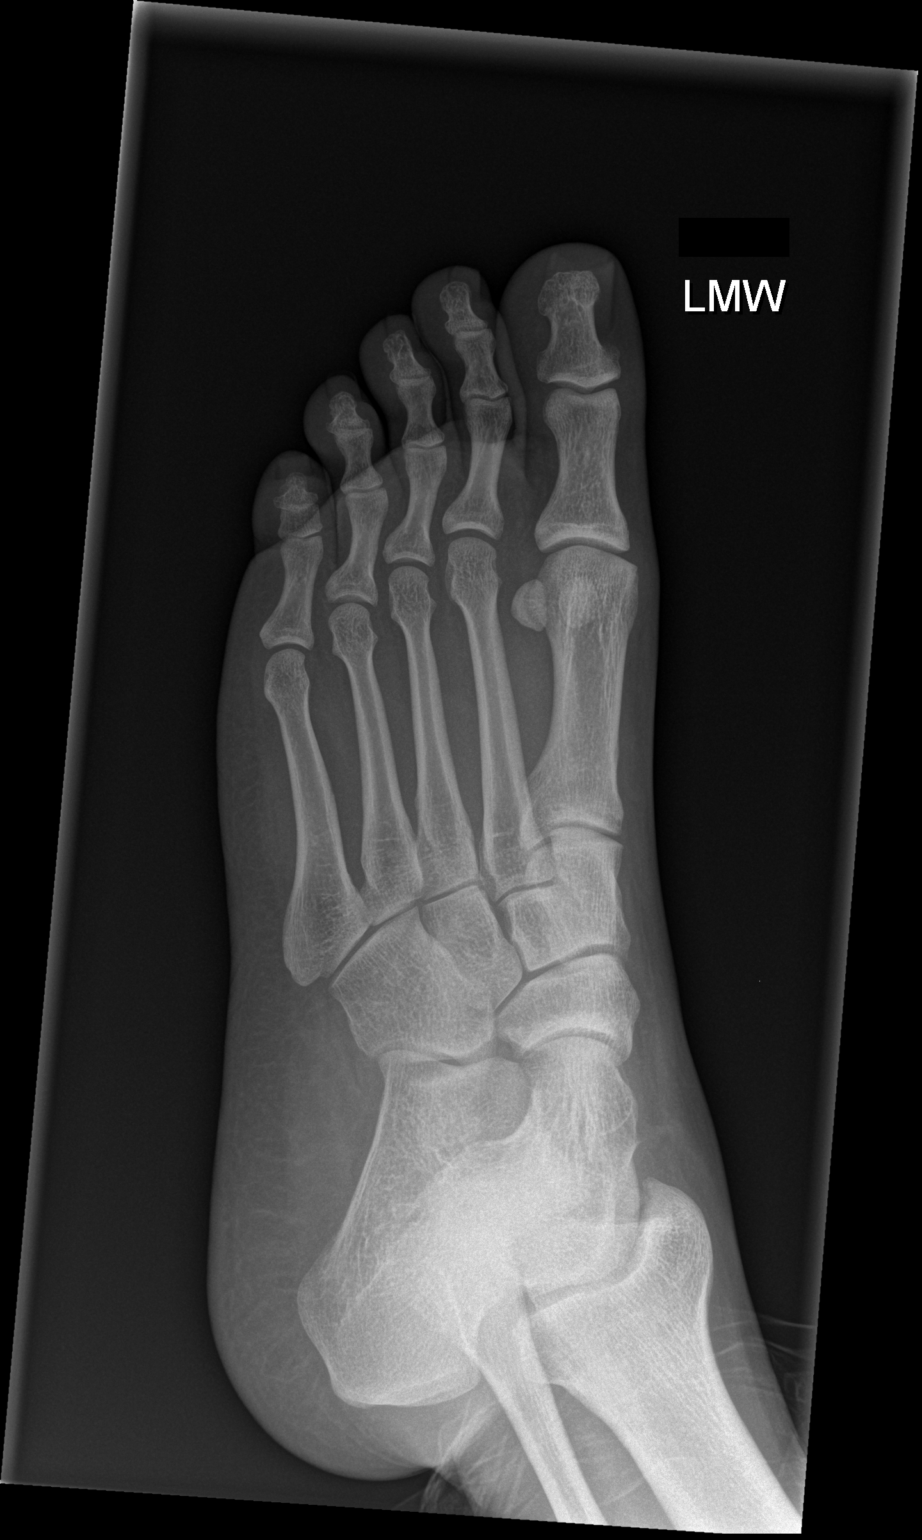
[im 3/3]
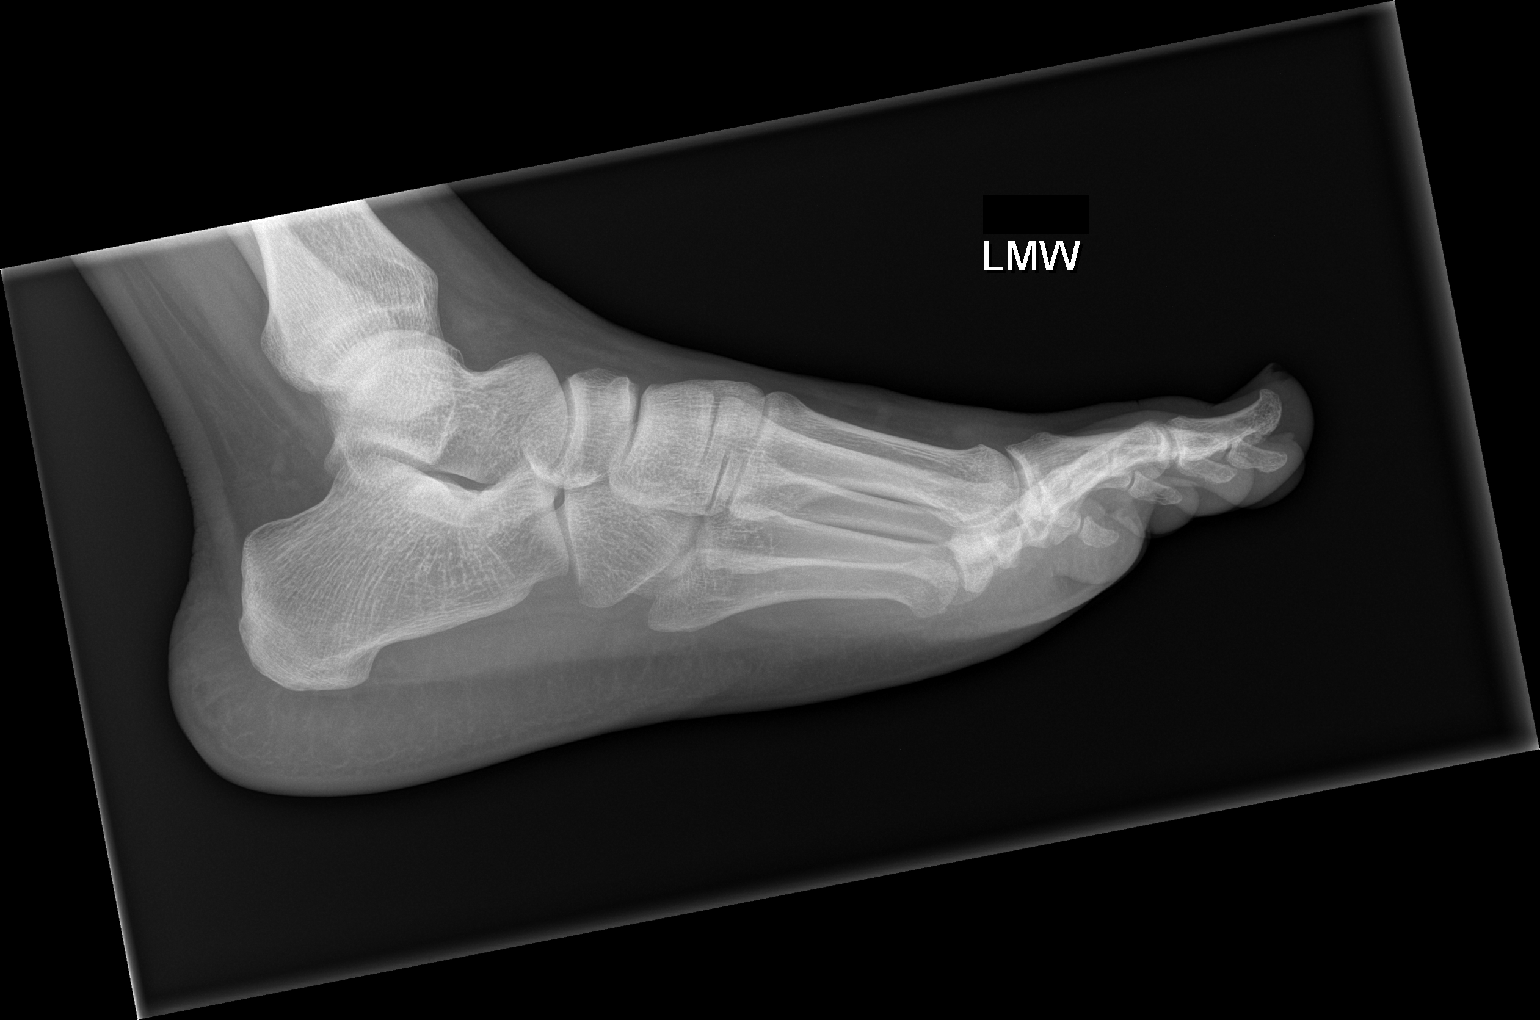

[3 of 3 positions shown; findings below may reference images not displayed]

FINDINGS: There is no evidence of fracture or dislocation. There is no
evidence of arthropathy or other focal bone abnormality. Soft
tissues are unremarkable.
IMPRESSION: Negative.

## 2016-06-26 ENCOUNTER — Emergency Department
Admission: EM | Admit: 2016-06-26 | Discharge: 2016-06-26 | Disposition: A | Discharge status: Home / Self Care | Payer: Medicaid Other | Attending: Emergency Medicine | Admitting: Emergency Medicine

## 2016-06-26 DIAGNOSIS — R531 Weakness: Secondary | ICD-10-CM | POA: Diagnosis not present

## 2016-06-26 DIAGNOSIS — F17200 Nicotine dependence, unspecified, uncomplicated: Secondary | ICD-10-CM | POA: Insufficient documentation

## 2016-06-26 DIAGNOSIS — Z5321 Procedure and treatment not carried out due to patient leaving prior to being seen by health care provider: Secondary | ICD-10-CM | POA: Insufficient documentation

## 2016-06-26 DIAGNOSIS — R42 Dizziness and giddiness: Secondary | ICD-10-CM | POA: Insufficient documentation

## 2016-06-26 NOTE — ED Triage Notes (Signed)
[facility] [place] [facility] after he had a syncopal episode at home after donating plasma earlier today. Pt states he is feeling much better and is no longer dizzy and would like to go home. Pt ambulatory with a steady gait. No distress noted. Speech clear. Pt verbalizes understanding that if symptoms return he should come back to ED for further evaluation.

## 2016-11-07 ENCOUNTER — Emergency Department
Admission: EM | Admit: 2016-11-07 | Discharge: 2016-11-07 | Discharge status: Against Medical Advice | Payer: Medicaid Other | Attending: Emergency Medicine | Admitting: Emergency Medicine

## 2016-11-07 ENCOUNTER — Other Ambulatory Visit: Payer: Self-pay

## 2016-11-07 ENCOUNTER — Encounter: Payer: Self-pay | Admitting: Emergency Medicine

## 2016-11-07 ENCOUNTER — Emergency Department: Payer: Medicaid Other

## 2016-11-07 DIAGNOSIS — Z5320 Procedure and treatment not carried out because of patient's decision for unspecified reasons: Secondary | ICD-10-CM | POA: Diagnosis not present

## 2016-11-07 DIAGNOSIS — Z9189 Other specified personal risk factors, not elsewhere classified: Secondary | ICD-10-CM | POA: Diagnosis not present

## 2016-11-07 DIAGNOSIS — R042 Hemoptysis: Secondary | ICD-10-CM | POA: Insufficient documentation

## 2016-11-07 DIAGNOSIS — R109 Unspecified abdominal pain: Secondary | ICD-10-CM | POA: Diagnosis not present

## 2016-11-07 DIAGNOSIS — F17200 Nicotine dependence, unspecified, uncomplicated: Secondary | ICD-10-CM | POA: Diagnosis not present

## 2016-11-07 LAB — CBC
HEMATOCRIT: 44.4 % (ref 40.0–52.0)
HEMOGLOBIN: 15.3 g/dL (ref 13.0–18.0)
MCH: 29.8 pg (ref 26.0–34.0)
MCHC: 34.4 g/dL (ref 32.0–36.0)
MCV: 86.7 fL (ref 80.0–100.0)
Platelets: 289 10*3/uL (ref 150–440)
RBC: 5.13 MIL/uL (ref 4.40–5.90)
RDW: 14 % (ref 11.5–14.5)
WBC: 6.5 10*3/uL (ref 3.8–10.6)

## 2016-11-07 LAB — TYPE AND SCREEN
ABO/RH(D): O NEG
Antibody Screen: NEGATIVE

## 2016-11-07 LAB — COMPREHENSIVE METABOLIC PANEL
ALBUMIN: 3.7 g/dL (ref 3.5–5.0)
ALK PHOS: 58 U/L (ref 38–126)
ALT: 38 U/L (ref 17–63)
AST: 44 U/L — ABNORMAL HIGH (ref 15–41)
Anion gap: 8 (ref 5–15)
BUN: 17 mg/dL (ref 6–20)
CHLORIDE: 110 mmol/L (ref 101–111)
CO2: 20 mmol/L — ABNORMAL LOW (ref 22–32)
Calcium: 9.2 mg/dL (ref 8.9–10.3)
Creatinine, Ser: 1.39 mg/dL — ABNORMAL HIGH (ref 0.61–1.24)
GFR calc non Af Amer: >60 mL/min (ref >60)
GLUCOSE: 88 mg/dL (ref 65–99)
POTASSIUM: 3.9 mmol/L (ref 3.5–5.1)
SODIUM: 138 mmol/L (ref 135–145)
Total Bilirubin: 1.5 mg/dL — ABNORMAL HIGH (ref 0.3–1.2)
Total Protein: 6.1 g/dL — ABNORMAL LOW (ref 6.5–8.1)

## 2016-11-07 LAB — PROTIME-INR
INR: 0.95
Prothrombin Time: 12.7 seconds (ref 11.4–15.2)

## 2016-11-07 LAB — LIPASE, BLOOD: Lipase: 29 U/L (ref 11–51)

## 2016-11-07 MED ORDER — PANTOPRAZOLE SODIUM 40 MG IV SOLR
40.0000 mg | Freq: Once | INTRAVENOUS | Status: AC
  Administered 2016-11-07: 40 mg via INTRAVENOUS
  Filled 2016-11-07: qty 40

## 2016-11-07 MED ORDER — IOPAMIDOL (ISOVUE-300) INJECTION 61%: 30.0000 mL | Freq: Once | INTRAVENOUS | Status: DC | PRN

## 2016-11-07 MED ORDER — GI COCKTAIL ~~LOC~~
30.0000 mL | Freq: Once | ORAL | Status: AC
  Administered 2016-11-07: 30 mL via ORAL
  Filled 2016-11-07: qty 30

## 2016-11-07 NOTE — ED Triage Notes (Signed)
Pt reports at around 9pm last night he started coughing up blood. Pt reports blood is bright red. Pt also reports pain to epigastric area. Denies NVD, denies recent injuries or illness. Pt reports this cough was clear but now he is coughing blood up again. Pt tearful in triage. Pt worried because he is a smoker.

## 2016-11-07 NOTE — ED Notes (Signed)
First nurse: ems reports pt coughing up blood since last night. VSS per ems. IV established. No meds given by ems.

## 2016-11-07 NOTE — ED Notes (Signed)
This RN informed that pt left AMA. IV was taken out by this RN and pt was stable and ambulatory last time pt was seen by this RN. NO recheck in vitals or signature obtained due to pt leaving without nurse being aware.
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
## 2016-11-07 NOTE — ED Notes (Signed)
Patient transported to CT 

## 2016-11-07 NOTE — ED Notes (Signed)
MD and CT made aware that pt is refusing CT at this time.

## 2016-11-07 NOTE — ED Notes (Signed)
Pt to desk asking about bloodwork. I told pt I would tell the edp that he was asking. Attempted to call pts nurse shannon, phone busy. Found nurse and edp in another room with a critical pt. Informed mr Hoelzel of that and it would be a few minutes. Pt walked by me in hall and said to tell doctor that he was discharging himself, no way was he waiting 3 more hours.

## 2017-10-14 ENCOUNTER — Other Ambulatory Visit: Payer: Self-pay

## 2017-10-14 ENCOUNTER — Encounter: Payer: Self-pay | Admitting: *Deleted

## 2017-10-14 ENCOUNTER — Emergency Department
Admission: EM | Admit: 2017-10-14 | Discharge: 2017-10-15 | Disposition: A | Discharge status: Home / Self Care | Payer: Medicaid Other | Attending: Student in an Organized Health Care Education/Training Program | Admitting: Student in an Organized Health Care Education/Training Program

## 2017-10-14 ENCOUNTER — Emergency Department: Payer: Medicaid Other

## 2017-10-14 DIAGNOSIS — F1210 Cannabis abuse, uncomplicated: Secondary | ICD-10-CM | POA: Insufficient documentation

## 2017-10-14 DIAGNOSIS — R079 Chest pain, unspecified: Secondary | ICD-10-CM | POA: Diagnosis present

## 2017-10-14 DIAGNOSIS — F17200 Nicotine dependence, unspecified, uncomplicated: Secondary | ICD-10-CM | POA: Diagnosis not present

## 2017-10-14 LAB — URINE DRUG SCREEN, QUALITATIVE (ARMC ONLY)
AMPHETAMINES, UR SCREEN: NOT DETECTED
BENZODIAZEPINE, UR SCRN: NOT DETECTED
CANNABINOID 50 NG, UR ~~LOC~~: POSITIVE — AB
Cocaine Metabolite,Ur ~~LOC~~: NOT DETECTED
MDMA (ECSTASY) UR SCREEN: NOT DETECTED
Methadone Scn, Ur: NOT DETECTED
Opiate, Ur Screen: NOT DETECTED
Phencyclidine (PCP) Ur S: NOT DETECTED
TRICYCLIC, UR SCREEN: NOT DETECTED

## 2017-10-14 LAB — BASIC METABOLIC PANEL
ANION GAP: 8 (ref 5–15)
BUN: 18 mg/dL (ref 6–20)
CALCIUM: 9 mg/dL (ref 8.9–10.3)
CO2: 25 mmol/L (ref 22–32)
Chloride: 105 mmol/L (ref 98–111)
Creatinine, Ser: 1.61 mg/dL — ABNORMAL HIGH (ref 0.61–1.24)
GFR, EST NON AFRICAN AMERICAN: 56 mL/min — AB (ref >60)
Glucose, Bld: 102 mg/dL — ABNORMAL HIGH (ref 70–99)
Potassium: 4.4 mmol/L (ref 3.5–5.1)
SODIUM: 138 mmol/L (ref 135–145)

## 2017-10-14 LAB — CBC
HCT: 44.2 % (ref 40.0–52.0)
HEMOGLOBIN: 15.1 g/dL (ref 13.0–18.0)
MCH: 30.2 pg (ref 26.0–34.0)
MCHC: 34.1 g/dL (ref 32.0–36.0)
MCV: 88.7 fL (ref 80.0–100.0)
Platelets: 317 10*3/uL (ref 150–440)
RBC: 4.98 MIL/uL (ref 4.40–5.90)
RDW: 14.1 % (ref 11.5–14.5)
WBC: 7.3 10*3/uL (ref 3.8–10.6)

## 2017-10-14 LAB — TROPONIN I

## 2017-10-14 MED ORDER — LORAZEPAM 1 MG PO TABS
1.0000 mg | ORAL_TABLET | Freq: Once | ORAL | Status: DC
  Filled 2017-10-14: qty 1

## 2017-10-14 MED ORDER — IPRATROPIUM-ALBUTEROL 0.5-2.5 (3) MG/3ML IN SOLN
3.0000 mL | Freq: Once | RESPIRATORY_TRACT | Status: AC
  Administered 2017-10-14: 3 mL via RESPIRATORY_TRACT
  Filled 2017-10-14: qty 3

## 2017-10-14 NOTE — ED Notes (Signed)
Pt refusing second blood test. Pt reports he has to go home at this time. MD made aware. Pt reports pain has decreased in his chest. RN had to wake pt upon entering the room.

## 2017-10-14 NOTE — ED Provider Notes (Signed)
[facility] Emergency Department Provider Note    First MD Initiated Contact with Patient 10/14/17 2059     (approximate)  I have reviewed the triage vital signs and the nursing notes.   HISTORY  Chief Complaint Chest Pain    HPI SHARRIEFF SPRATLIN is a 32 y.o. male presents to the ER chief complaint of chest pain shortness of breath that started this afternoon after the patient was smoking a blunt after leaving work.  Smokes 1 pack/day.  Denies any chest pain at this moment.  States it did feel mild to moderate in severity particular the left side of his chest.  Denies any cough.  No lower extremity swelling.  Denies any other recreational drug use other than the blunt.  Had similar episodes in the past.    No past medical history on file. No family history on file. No past surgical history on file. There are no active problems to display for this patient.     Prior to Admission medications   Medication Sig Start Date End Date Taking? Authorizing Provider  ibuprofen (MOTRIN IB) 200 MG tablet Take 3 tablets (600 mg total) by mouth every 6 (six) hours as needed. 12/23/14   Sharman Cheek, MD    Allergies Aspirin    Social History Social History   Tobacco Use  . Smoking status: Current Every Day Smoker  . Smokeless tobacco: Never Used  Substance Use Topics  . Alcohol use: Yes  . Drug use: Yes    Types: Marijuana    Review of Systems Patient denies headaches, rhinorrhea, blurry vision, numbness, shortness of breath, chest pain, edema, cough, abdominal pain, nausea, vomiting, diarrhea, dysuria, fevers, rashes or hallucinations unless otherwise stated above in HPI. ____________________________________________   PHYSICAL EXAM:  VITAL SIGNS: Vitals:   10/14/17 1929  BP: 130/78  Pulse: 69  Resp: 18  Temp: 99 F (37.2 C)  SpO2: 99%    Constitutional: Alert and oriented.  Eyes: Conjunctivae are normal.  Head: Atraumatic. Nose: No  congestion/rhinnorhea. Mouth/Throat: Mucous membranes are moist.   Neck: No stridor. Painless ROM.  Cardiovascular: Normal rate, regular rhythm. Grossly normal heart sounds.  Good peripheral circulation. Respiratory: Normal respiratory effort.  No retractions. Lungs CTAB. Gastrointestinal: Soft and nontender. No distention. No abdominal bruits. No CVA tenderness. Genitourinary: deferred Musculoskeletal: No lower extremity tenderness nor edema.  No joint effusions. Neurologic:  Normal speech and language. No gross focal neurologic deficits are appreciated. No facial droop Skin:  Skin is warm, dry and intact. No rash noted. Psychiatric: Mood and affect are normal. Speech and behavior are normal.  ____________________________________________   LABS (all labs ordered are listed, but only abnormal results are displayed)  Results for orders placed or performed during the hospital encounter of 10/14/17 (from the past 24 hour(s))  Basic metabolic panel     Status: Abnormal   Collection Time: 10/14/17  7:33 PM  Result Value Ref Range   Sodium 138 135 - 145 mmol/L   Potassium 4.4 3.5 - 5.1 mmol/L   Chloride 105 98 - 111 mmol/L   CO2 25 22 - 32 mmol/L   Glucose, Bld 102 (H) 70 - 99 mg/dL   BUN 18 6 - 20 mg/dL   Creatinine, Ser 5.40 (H) 0.61 - 1.24 mg/dL   Calcium 9.0 8.9 - 98.1 mg/dL   GFR calc non Af Amer 56 (L) >60 mL/min   GFR calc Af Amer >60 >60 mL/min   Anion gap 8 5 -  15  CBC     Status: None   Collection Time: 10/14/17  7:33 PM  Result Value Ref Range   WBC 7.3 3.8 - 10.6 K/uL   RBC 4.98 4.40 - 5.90 MIL/uL   Hemoglobin 15.1 13.0 - 18.0 g/dL   HCT 96.044.2 45.440.0 - 09.852.0 %   MCV 88.7 80.0 - 100.0 fL   MCH 30.2 26.0 - 34.0 pg   MCHC 34.1 32.0 - 36.0 g/dL   RDW 11.914.1 14.711.5 - 82.914.5 %   Platelets 317 150 - 440 K/uL  Troponin I     Status: None   Collection Time: 10/14/17  7:33 PM  Result Value Ref Range   Troponin I <0.03 <0.03 ng/mL  Urine Drug Screen, Qualitative ([facility] only)      Status: Abnormal   Collection Time: 10/14/17  9:31 PM  Result Value Ref Range   Tricyclic, Ur Screen NONE DETECTED NONE DETECTED   Amphetamines, Ur Screen NONE DETECTED NONE DETECTED   MDMA (Ecstasy)Ur Screen NONE DETECTED NONE DETECTED   Cocaine Metabolite,Ur [place] NONE DETECTED NONE DETECTED   Opiate, Ur Screen NONE DETECTED NONE DETECTED   Phencyclidine (PCP) Ur S NONE DETECTED NONE DETECTED   Cannabinoid 50 Ng, Ur [place] POSITIVE (A) NONE DETECTED   Barbiturates, Ur Screen (A) NONE DETECTED    Result not available. Reagent lot number recalled by manufacturer.   Benzodiazepine, Ur Scrn NONE DETECTED NONE DETECTED   Methadone Scn, Ur NONE DETECTED NONE DETECTED   ____________________________________________  EKG My review and personal interpretation at Time: 19:33   Indication: chest pain  Rate: 65  Rhythm: sinus Axis: normal Other: normal intervals, no stemi ____________________________________________  RADIOLOGY  I personally reviewed all radiographic images ordered to evaluate for the above acute complaints and reviewed radiology reports and findings.  These findings were personally discussed with the patient.  Please see medical record for radiology report.  ____________________________________________   PROCEDURES  Procedure(s) performed:  Procedures    Critical Care performed: no ____________________________________________   INITIAL IMPRESSION / ASSESSMENT AND PLAN / ED COURSE  Pertinent labs & imaging results that were available during my care of the patient were reviewed by me and considered in my medical decision making (see chart for details).   DDX: ACS, pericarditis, esophagitis, boerhaaves, pe, dissection, pna, bronchitis, costochondritis   Vara GuardianQuentin L Gal is a 32 y.o. who presents to the ED with symptoms as described above.  Patient low risk by heart score.  UDS ordered to also evaluate for cocaine induced chest pain is positive only for marijuana.  Patient  given nebulizer treatment for component of bronchitis.  Seems primarily musculoskeletal.  Patient low risk heart score.  Not clinically consistent with ACS.  Unlikely dissection.  Patient is low risk by Wells criteria and is PERC negative.  Patient is pain shortly prior to arrival will observe in the ER for repeat troponin.  Clinical Course as of Oct 14 2325  Mon Oct 14, 2017  2321 Patient refused repeat troponin.  I was seeing another patient and the patient told nurse that he was feeling fine and eloped.  Patient ambulated with steady gait.  Did not seem clinically consistent with ACS, dissection, PE.  No evidence of pneumonia.   [PR]    Clinical Course User Index [PR] Willy Eddyobinson, Jarret Torre, MD     As part of my medical decision making, I reviewed the following data within the electronic MEDICAL RECORD NUMBER Nursing notes reviewed and incorporated, Labs reviewed, notes from prior  ED visits.  ____________________________________________   FINAL CLINICAL IMPRESSION(S) / ED DIAGNOSES  Final diagnoses:  Chest pain, unspecified type  Marijuana abuse      NEW MEDICATIONS STARTED DURING THIS VISIT:  New Prescriptions   No medications on file     Note:  This document was prepared using Dragon voice recognition software and may include unintentional dictation errors.    Willy Eddy, MD 10/14/17 [phone]

## 2017-10-14 NOTE — ED Triage Notes (Signed)
Pt states chest pain this afternoon.  Intermittent sob.  Pt reports smoking 1 blunt today.  cig smoker 1ppd.  Pt alert.

## 2017-10-15 NOTE — ED Notes (Signed)
Pt left yesterday with last documentation. Delay in charting to keep pt on the board.

## 2017-10-15 NOTE — ED Notes (Signed)
Pt came out of room and reported to RN that he did not have time to wait for discharge paperwork. Patient reports knowledge of discharge instructions but did not need to paperwork. Pt ambulatory and in NAD. MD made aware.

## 2017-10-23 ENCOUNTER — Ambulatory Visit: Payer: Self-pay | Admitting: Family Medicine

## 2017-12-12 ENCOUNTER — Telehealth: Payer: Self-pay | Admitting: Family Medicine

## 2017-12-12 NOTE — Telephone Encounter (Signed)
Lft message per the Dr if they were scheduled and they were a No Show then they will not schedule them again

## 2017-12-12 NOTE — Telephone Encounter (Signed)
Copied from CRM [id number]. Topic: Quick Communication - See Telephone Encounter >> Dec 12, 2017 12:22 PM Luanna Cole wrote: CRM for notification. See Telephone encounter for: 12/12/17. Pt called to schedule new patient appointment. Patient no showed last new patient appointment and would like to know if he could reschedule. Please advise

## 2018-10-24 ENCOUNTER — Emergency Department: Payer: Medicaid Other

## 2018-10-24 ENCOUNTER — Encounter: Payer: Self-pay | Admitting: Emergency Medicine

## 2018-10-24 ENCOUNTER — Emergency Department
Admission: EM | Admit: 2018-10-24 | Discharge: 2018-10-24 | Disposition: A | Discharge status: Home / Self Care | Payer: Medicaid Other | Attending: Student in an Organized Health Care Education/Training Program | Admitting: Student in an Organized Health Care Education/Training Program

## 2018-10-24 ENCOUNTER — Other Ambulatory Visit: Payer: Self-pay

## 2018-10-24 DIAGNOSIS — R05 Cough: Secondary | ICD-10-CM | POA: Diagnosis present

## 2018-10-24 DIAGNOSIS — J069 Acute upper respiratory infection, unspecified: Secondary | ICD-10-CM | POA: Diagnosis not present

## 2018-10-24 DIAGNOSIS — Z20828 Contact with and (suspected) exposure to other viral communicable diseases: Secondary | ICD-10-CM | POA: Diagnosis not present

## 2018-10-24 DIAGNOSIS — F17200 Nicotine dependence, unspecified, uncomplicated: Secondary | ICD-10-CM | POA: Insufficient documentation

## 2018-10-24 MED ORDER — AZITHROMYCIN 250 MG PO TABS: ORAL_TABLET | ORAL | 0 refills | Status: AC

## 2018-10-24 MED ORDER — HYDROCOD POLST-CPM POLST ER 10-8 MG/5ML PO SUER: 5.0000 mL | Freq: Two times a day (BID) | ORAL | 0 refills | Status: DC

## 2018-10-24 NOTE — Discharge Instructions (Signed)
Advised self quarantine pending results of covert test.  Results are normal available in 48 hours and you have been notified telephonically.

## 2018-10-24 NOTE — ED Provider Notes (Signed)
[facility] Emergency Department Provider Note   ____________________________________________   First MD Initiated Contact with Patient 10/24/18 [phone]     (approximate)  I have reviewed the triage vital signs and the nursing notes.   HISTORY  Chief Complaint Cough, Fever, and Nasal Congestion    HPI Bryan Walters is a 33 y.o. male patient presents with productive cough and fever for 2 days.  Patient also complaining body aches and fatigue.  Patient denies recent travel.  Patient is exposed to pneumonia from his daughter who was diagnosed last week.  Patient also complained of nasal congestion and postnasal drainage.  Patient denies sore throat.  Patient denies nausea, vomiting, diarrhea.  Patient rates his pain discomfort a 10/10.  Patient scribed pain is "achy".  No palliative measures for complaint.         History reviewed. No pertinent past medical history.  There are no active problems to display for this patient.   History reviewed. No pertinent surgical history.  Prior to Admission medications   Medication Sig Start Date End Date Taking? Authorizing Provider  azithromycin (ZITHROMAX Z-PAK) 250 MG tablet Take 2 tablets (500 mg) on  Day 1,  followed by 1 tablet (250 mg) once daily on Days 2 through 5. 10/24/18 10/29/18  Sable Feil, PA-C  chlorpheniramine-HYDROcodone (TUSSIONEX PENNKINETIC ER) 10-8 MG/5ML SUER Take 5 mLs by mouth 2 (two) times daily. 10/24/18   Sable Feil, PA-C    Allergies Aspirin  No family history on file.  Social History Social History   Tobacco Use  . Smoking status: Current Every Day Smoker  . Smokeless tobacco: Never Used  Substance Use Topics  . Alcohol use: Yes  . Drug use: Yes    Types: Marijuana    Review of Systems Constitutional:  Fever/chills and body aches. Eyes: No visual changes. ENT: No sore throat.  Nasal congestion. Cardiovascular: Denies chest pain. Respiratory: Denies shortness of  breath.  Productive cough. Gastrointestinal: No abdominal pain.  No nausea, no vomiting.  No diarrhea.  No constipation. Genitourinary: Negative for dysuria. Musculoskeletal: Negative for back pain. Skin: Negative for rash. Neurological: Negative for headaches, focal weakness or numbness. Allergic/Immunilogical: Aspirin ____________________________________________   PHYSICAL EXAM:  VITAL SIGNS: ED Triage Vitals  Enc Vitals Group     BP 10/24/18 0937 130/86     Pulse Rate 10/24/18 0937 84     Resp 10/24/18 0937 20     Temp 10/24/18 0937 99.1 F (37.3 C)     Temp Source 10/24/18 0937 Oral     SpO2 10/24/18 0937 97 %     Weight 10/24/18 0934 185 lb (83.9 kg)     Height 10/24/18 0934 5\' 6"  (1.676 m)     Head Circumference --      Peak Flow --      Pain Score 10/24/18 0934 10     Pain Loc --      Pain Edu? --      Excl. in [place]? --    Constitutional: Alert and oriented. Well appearing and in no acute distress. Nose: Bilateral maxillary guarding with edematous nasal turbinates.   Mouth/Throat: Mucous membranes are moist.  Oropharynx non-erythematous.  Postnasal drainage. Neck: No stridor.   Hematological/Lymphatic/Immunilogical: No cervical lymphadenopathy. Cardiovascular: Normal rate, regular rhythm. Grossly normal heart sounds.  Good peripheral circulation. Respiratory: Normal respiratory effort.  No retractions. Lungs CTAB. Gastrointestinal: Soft and nontender. No distention. No abdominal bruits. No CVA tenderness. Neurologic:  Normal speech  and language. No gross focal neurologic deficits are appreciated. No gait instability. Skin:  Skin is warm, dry and intact. No rash noted. Psychiatric: Mood and affect are normal. Speech and behavior are normal.  ____________________________________________   LABS (all labs ordered are listed, but only abnormal results are displayed)  Labs Reviewed  NOVEL CORONAVIRUS, NAA (HOSPITAL ORDER, SEND-OUT TO REF LAB)    ____________________________________________  EKG   ____________________________________________  RADIOLOGY  ED MD interpretation:    Official radiology report(s): Dg Chest 1 View  Result Date: 10/24/2018 CLINICAL DATA:  33 year old with fever and cough. EXAM: CHEST  1 VIEW COMPARISON:  None. FINDINGS: The heart size and mediastinal contours are within normal limits. Both lungs are clear. The visualized skeletal structures are unremarkable. IMPRESSION: No active disease. Electronically Signed   By: Richarda OverlieAdam  Henn M.D.   On: 10/24/2018 10:41    ____________________________________________   PROCEDURES  Procedure(s) performed (including Critical Care):  Procedures   ____________________________________________   INITIAL IMPRESSION / ASSESSMENT AND PLAN / ED COURSE  As part of my medical decision making, I reviewed the following data within the electronic MEDICAL RECORD NUMBER         Bryan Walters was evaluated in Emergency Department on 10/24/2018 for the symptoms described in the history of present illness. He was evaluated in the context of the global COVID-19 pandemic, which necessitated consideration that the patient might be at risk for infection with the SARS-CoV-2 virus that causes COVID-19. Institutional protocols and algorithms that pertain to the evaluation of patients at risk for COVID-19 are in a state of rapid change based on information released by regulatory bodies including the CDC and federal and state organizations. These policies and algorithms were followed during the patient's care in the ED.    Patient presents with respiratory infection consistent with productive cough and body aches.  Patient has recent exposure to pneumonia.  Patient chest x-ray is grossly unremarkable.  Patient given discharge care instructions and advised to call for test results are pending.   ____________________________________________   FINAL CLINICAL IMPRESSION(S) / ED DIAGNOSES   Final diagnoses:  Acute upper respiratory infection     ED Discharge Orders         Ordered    azithromycin (ZITHROMAX Z-PAK) 250 MG tablet     10/24/18 1048    chlorpheniramine-HYDROcodone (TUSSIONEX PENNKINETIC ER) 10-8 MG/5ML SUER  2 times daily     10/24/18 1048           Note:  This document was prepared using Dragon voice recognition software and may include unintentional dictation errors.    Joni ReiningSmith,  K, PA-C 10/24/18 1049    Willy Eddyobinson, Patrick, MD 10/24/18 1235

## 2018-10-24 NOTE — ED Notes (Signed)
See triage note  Presents with cough   Sore throat and chills  Low grade fever on arrival   States cough is prod   Yellowish phlegm

## 2018-10-24 NOTE — ED Triage Notes (Signed)
Pt reports last pm started with upper respiratory sx's and fever.

## 2018-10-25 LAB — NOVEL CORONAVIRUS, NAA (HOSP ORDER, SEND-OUT TO REF LAB; TAT 18-24 HRS): SARS-CoV-2, NAA: NOT DETECTED

## 2018-12-01 ENCOUNTER — Emergency Department: Payer: Medicaid Other

## 2018-12-01 ENCOUNTER — Emergency Department
Admission: EM | Admit: 2018-12-01 | Discharge: 2018-12-01 | Disposition: A | Discharge status: Home / Self Care | Payer: Medicaid Other | Attending: Emergency Medicine | Admitting: Emergency Medicine

## 2018-12-01 ENCOUNTER — Encounter: Payer: Self-pay | Admitting: Emergency Medicine

## 2018-12-01 ENCOUNTER — Other Ambulatory Visit: Payer: Self-pay

## 2018-12-01 DIAGNOSIS — S199XXA Unspecified injury of neck, initial encounter: Secondary | ICD-10-CM | POA: Diagnosis present

## 2018-12-01 DIAGNOSIS — M25511 Pain in right shoulder: Secondary | ICD-10-CM | POA: Insufficient documentation

## 2018-12-01 DIAGNOSIS — Y999 Unspecified external cause status: Secondary | ICD-10-CM | POA: Insufficient documentation

## 2018-12-01 DIAGNOSIS — Y92410 Unspecified street and highway as the place of occurrence of the external cause: Secondary | ICD-10-CM | POA: Insufficient documentation

## 2018-12-01 DIAGNOSIS — M25561 Pain in right knee: Secondary | ICD-10-CM | POA: Insufficient documentation

## 2018-12-01 DIAGNOSIS — F17210 Nicotine dependence, cigarettes, uncomplicated: Secondary | ICD-10-CM | POA: Insufficient documentation

## 2018-12-01 DIAGNOSIS — Y939 Activity, unspecified: Secondary | ICD-10-CM | POA: Diagnosis not present

## 2018-12-01 DIAGNOSIS — S161XXA Strain of muscle, fascia and tendon at neck level, initial encounter: Secondary | ICD-10-CM | POA: Insufficient documentation

## 2018-12-01 DIAGNOSIS — M7918 Myalgia, other site: Secondary | ICD-10-CM

## 2018-12-01 MED ORDER — IBUPROFEN 600 MG PO TABS
600.0000 mg | ORAL_TABLET | Freq: Once | ORAL | Status: AC
  Administered 2018-12-01: 600 mg via ORAL
  Filled 2018-12-01: qty 1

## 2018-12-01 MED ORDER — CYCLOBENZAPRINE HCL 10 MG PO TABS: 10.0000 mg | ORAL_TABLET | Freq: Three times a day (TID) | ORAL | 0 refills | Status: DC | PRN

## 2018-12-01 MED ORDER — CYCLOBENZAPRINE HCL 10 MG PO TABS
10.0000 mg | ORAL_TABLET | Freq: Once | ORAL | Status: AC
  Administered 2018-12-01: 14:00:00 10 mg via ORAL
  Filled 2018-12-01: qty 1

## 2018-12-01 MED ORDER — IBUPROFEN 600 MG PO TABS: 600.0000 mg | ORAL_TABLET | Freq: Three times a day (TID) | ORAL | 0 refills | Status: DC | PRN
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
## 2018-12-01 NOTE — Discharge Instructions (Signed)
Follow discharge care instruction take medication as directed. °
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
## 2019-05-07 ENCOUNTER — Other Ambulatory Visit: Payer: Self-pay

## 2019-05-07 DIAGNOSIS — R1012 Left upper quadrant pain: Secondary | ICD-10-CM | POA: Diagnosis present

## 2019-05-07 DIAGNOSIS — Z5321 Procedure and treatment not carried out due to patient leaving prior to being seen by health care provider: Secondary | ICD-10-CM | POA: Diagnosis not present

## 2019-05-07 DIAGNOSIS — N2889 Other specified disorders of kidney and ureter: Secondary | ICD-10-CM | POA: Diagnosis not present

## 2019-05-07 MED ORDER — SODIUM CHLORIDE 0.9% FLUSH: 3.0000 mL | Freq: Once | INTRAVENOUS | Status: DC

## 2019-05-07 NOTE — ED Triage Notes (Signed)
Pt to the er via ems for LUQ pain x 2 weeks. Pt went to [facility] 3 days ago and did a CT scan and had fluid on his kidneys but it would pass. Pt states he has not gotten over relief. HR 70, Bp 129/83, 99% on room air.

## 2019-05-08 ENCOUNTER — Telehealth: Payer: Self-pay | Admitting: Emergency Medicine

## 2019-05-08 ENCOUNTER — Emergency Department
Admission: EM | Admit: 2019-05-08 | Discharge: 2019-05-08 | Disposition: A | Discharge status: Home / Self Care | Payer: Medicaid Other | Attending: Emergency Medicine | Admitting: Emergency Medicine

## 2019-05-08 LAB — LIPASE, BLOOD: Lipase: 42 U/L (ref 11–51)

## 2019-05-08 LAB — CBC
HCT: 42 % (ref 39.0–52.0)
Hemoglobin: 13.8 g/dL (ref 13.0–17.0)
MCH: 29.4 pg (ref 26.0–34.0)
MCHC: 32.9 g/dL (ref 30.0–36.0)
MCV: 89.4 fL (ref 80.0–100.0)
Platelets: 393 10*3/uL (ref 150–400)
RBC: 4.7 MIL/uL (ref 4.22–5.81)
RDW: 13.7 % (ref 11.5–15.5)
WBC: 11.2 10*3/uL — ABNORMAL HIGH (ref 4.0–10.5)
nRBC: 0 % (ref 0.0–0.2)

## 2019-05-08 LAB — URINALYSIS, COMPLETE (UACMP) WITH MICROSCOPIC
Bacteria, UA: NONE SEEN
Bilirubin Urine: NEGATIVE
Glucose, UA: NEGATIVE mg/dL
Hgb urine dipstick: NEGATIVE
Ketones, ur: NEGATIVE mg/dL
Leukocytes,Ua: NEGATIVE
Nitrite: NEGATIVE
Protein, ur: NEGATIVE mg/dL
Specific Gravity, Urine: 1.028 (ref 1.005–1.030)
Squamous Epithelial / HPF: NONE SEEN (ref 0–5)
pH: 5 (ref 5.0–8.0)

## 2019-05-08 LAB — COMPREHENSIVE METABOLIC PANEL
ALT: 50 U/L — ABNORMAL HIGH (ref 0–44)
AST: 51 U/L — ABNORMAL HIGH (ref 15–41)
Albumin: 4.3 g/dL (ref 3.5–5.0)
Alkaline Phosphatase: 79 U/L (ref 38–126)
Anion gap: 10 (ref 5–15)
BUN: 22 mg/dL — ABNORMAL HIGH (ref 6–20)
CO2: 24 mmol/L (ref 22–32)
Calcium: 9.1 mg/dL (ref 8.9–10.3)
Chloride: 104 mmol/L (ref 98–111)
Creatinine, Ser: 1.46 mg/dL — ABNORMAL HIGH (ref 0.61–1.24)
GFR calc Af Amer: >60 mL/min (ref >60)
GFR calc non Af Amer: >60 mL/min (ref >60)
Glucose, Bld: 90 mg/dL (ref 70–99)
Potassium: 4.9 mmol/L (ref 3.5–5.1)
Sodium: 138 mmol/L (ref 135–145)
Total Bilirubin: 0.9 mg/dL (ref 0.3–1.2)
Total Protein: 7.5 g/dL (ref 6.5–8.1)

## 2019-05-08 LAB — TROPONIN I (HIGH SENSITIVITY): Troponin I (High Sensitivity): 3 ng/L (ref <18)

## 2019-05-08 NOTE — ED Notes (Signed)
No answer when called several times from lobby 

## 2019-05-08 NOTE — Telephone Encounter (Signed)
Called patient due to lwot to inquire about condition and follow up plans. No answer. 

## 2019-10-07 ENCOUNTER — Telehealth: Payer: Self-pay | Admitting: General Practice

## 2019-10-07 NOTE — Telephone Encounter (Signed)
Individual has been contacted 3+ times regarding ED referral. Individual is no longer eligible here in the clinic, as they have Medicaid.
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
## 2021-03-12 ENCOUNTER — Emergency Department
Admission: EM | Admit: 2021-03-12 | Discharge: 2021-03-12 | Disposition: A | Discharge status: Home / Self Care | Payer: Medicaid Other | Attending: Emergency Medicine | Admitting: Emergency Medicine

## 2021-03-12 ENCOUNTER — Other Ambulatory Visit: Payer: Self-pay

## 2021-03-12 DIAGNOSIS — F410 Panic disorder [episodic paroxysmal anxiety] without agoraphobia: Secondary | ICD-10-CM

## 2021-03-12 DIAGNOSIS — F1410 Cocaine abuse, uncomplicated: Secondary | ICD-10-CM | POA: Diagnosis not present

## 2021-03-12 DIAGNOSIS — F419 Anxiety disorder, unspecified: Secondary | ICD-10-CM | POA: Insufficient documentation

## 2021-03-12 DIAGNOSIS — F17210 Nicotine dependence, cigarettes, uncomplicated: Secondary | ICD-10-CM | POA: Insufficient documentation

## 2021-03-12 DIAGNOSIS — F1910 Other psychoactive substance abuse, uncomplicated: Secondary | ICD-10-CM | POA: Insufficient documentation

## 2021-03-12 HISTORY — DX: Cocaine abuse, uncomplicated: F14.10

## 2021-03-12 HISTORY — DX: Panic disorder (episodic paroxysmal anxiety): F41.0

## 2021-03-12 NOTE — ED Triage Notes (Addendum)
Pt presents to ER c/o a panic attack.  Pt states he has had some anxiety for a long time and tonight everything came to a breaking point.  Pt states he has been feeling very stressed recently.  Pt tearful in triage and noted to be hyperventilating at times.  Pt otherwise A&O x4 and in NAD.  Pt states he did cocaine around an hr ago and has drank around a pint of hard liquor today.

## 2021-03-12 NOTE — ED Provider Notes (Signed)
[facility] Emergency Department Provider Note  ____________________________________________   Event Date/Time   First MD Initiated Contact with Patient 03/12/21 [phone]     (approximate)  I have reviewed the triage vital signs and the nursing notes.   HISTORY  Chief Complaint Panic Attack    HPI Bryan Walters is a 35 y.o. male with medical history as listed below who presents for evaluation of what he describes as panic attack.  He said he has been under a lot of stress recently and it is got to be too much.  He also admits to smoking crack cocaine earlier tonight and drinking some liquor, he estimates about 3 hours before he arrived to the emergency department.  His symptoms were severe and acute in onset and he said that mostly he just felt like it was too much and that he was having some shortness of breath.  He denies that he had any chest pain but he felt some tightness throughout his body.  He has been sleeping in the waiting room and said he feels much better now.  He still feels a little bit depressed but he denies SI and HI.  He said he has been in rehab before.  He does not have a current therapist.     Past Medical History:  Diagnosis Date   Cocaine abuse (HCC)    reported by patient   Panic attacks    reported by patient    There are no problems to display for this patient.   Past Surgical History:  Procedure Laterality Date   REFRACTIVE SURGERY      Prior to Admission medications   Medication Sig Start Date End Date Taking? Authorizing Provider  cyclobenzaprine (FLEXERIL) 10 MG tablet Take 1 tablet (10 mg total) by mouth 3 (three) times daily as needed. 12/01/18   Joni Reining, PA-C  ibuprofen (ADVIL) 600 MG tablet Take 1 tablet (600 mg total) by mouth every 8 (eight) hours as needed. 12/01/18   Joni Reining, PA-C    Allergies Aspirin  History reviewed. No pertinent family history.  Social History Social History    Tobacco Use   Smoking status: Every Day    Types: Cigarettes   Smokeless tobacco: Never  Substance Use Topics   Alcohol use: Yes   Drug use: Yes    Types: Marijuana    Review of Systems Constitutional: No fever/chills Eyes: No visual changes. ENT: No sore throat. Cardiovascular: Denies chest pain but had some tightness Respiratory: Tightness and rapid breathing Gastrointestinal: No abdominal pain.  No nausea, no vomiting.  No diarrhea.  No constipation. Genitourinary: Negative for dysuria. Musculoskeletal: Negative for neck pain.  Negative for back pain. Integumentary: Negative for rash. Neurological: Negative for headaches, focal weakness or numbness. Psychiatric: Reports depression, anxiety, and polysubstance abuse.  ____________________________________________   PHYSICAL EXAM:  VITAL SIGNS: ED Triage Vitals  Enc Vitals Group     BP 03/12/21 0056 116/79     Pulse Rate 03/12/21 0056 83     Resp 03/12/21 0056 (!) 22     Temp 03/12/21 0056 97.8 F (36.6 C)     Temp Source 03/12/21 0056 Oral     SpO2 03/12/21 0056 96 %     Weight 03/12/21 0058 72.6 kg (160 lb)     Height 03/12/21 0058 1.778 m (5\' 10" )     Head Circumference --      Peak Flow --      Pain Score  03/12/21 0055 0     Pain Loc --      Pain Edu? --      Excl. in GC? --     Constitutional: Alert and oriented.  Eyes: Conjunctivae are injected but otherwise normal.  No nystagmus, pupils are equal and reactive. Head: Atraumatic. Nose: No congestion/rhinnorhea. Mouth/Throat: Patient is wearing a mask. Neck: No stridor.  No meningeal signs.   Cardiovascular: Normal rate, regular rhythm. Good peripheral circulation. Respiratory: Normal respiratory effort.  No retractions. Gastrointestinal: Soft and nontender. No distention.  Musculoskeletal: No lower extremity tenderness nor edema. No gross deformities of extremities. Neurologic:  Normal speech and language. No gross focal neurologic deficits are  appreciated.  Skin:  Skin is warm, dry and intact. Psychiatric: Mood and affect are somewhat depressed and blunted.  Definitively denies SI and HI.  Admits to substance abuse.  Admits to some depression and prior episodes of "anxiety attacks".  ____________________________________________     INITIAL IMPRESSION / MDM / ASSESSMENT AND PLAN / ED COURSE  As part of my medical decision making, I reviewed the following data within the electronic MEDICAL RECORD NUMBER Nursing notes reviewed and incorporated, Old chart reviewed, and Notes from prior ED visits   Differential diagnosis includes, but is not limited to, anxiety, depression, substance induced mood disorder, bipolar disorder, much less likely ACS or PE.  Patient is well-appearing and in no distress.  He has been sleeping in the waiting room.  It has been approximately 7+ hours since he ingested any substances including alcohol or cocaine.  He is clinically sober and essentially asymptomatic at this time.  He said that his episode felt just like prior anxiety attacks.  There is no indication for lab work or EKG at this time.  He did not have any chest pain and has no ongoing symptoms currently.  I offered to provide the patient with some outpatient follow-up resources and he agrees with this plan and wants to go home.  I gave my usual and customary return precautions.           ____________________________________________  FINAL CLINICAL IMPRESSION(S) / ED DIAGNOSES  Final diagnoses:  Anxiety attack  Cocaine abuse (HCC)  Polysubstance abuse (HCC)     MEDICATIONS GIVEN DURING THIS VISIT:  Medications - No data to display   ED Discharge Orders     None        Note:  This document was prepared using Dragon voice recognition software and may include unintentional dictation errors.   Loleta Rose, MD 03/12/21 [phone]

## 2021-03-12 NOTE — Discharge Instructions (Signed)
You have been seen in the Emergency Department (ED) today for probable anxiety attack, as well as cocaine and alcohol use.  Please follow up with the recommended doctor as instructed above in these documents regarding today's emergent visit and your recent symptoms to discuss further management.  Continue to take your regular medications. Please avoid alcohol and drug use.  Return to the Emergency Department (ED) if you experience any further chest pain/pressure/tightness, difficulty breathing, or sudden sweating, or other symptoms that concern you.

## 2021-06-28 ENCOUNTER — Emergency Department: Payer: Medicaid Other

## 2021-06-28 ENCOUNTER — Other Ambulatory Visit: Payer: Self-pay

## 2021-06-28 ENCOUNTER — Emergency Department
Admission: EM | Admit: 2021-06-28 | Discharge: 2021-06-29 | Disposition: A | Discharge status: Home / Self Care | Payer: Medicaid Other | Attending: Emergency Medicine | Admitting: Emergency Medicine

## 2021-06-28 DIAGNOSIS — R1031 Right lower quadrant pain: Secondary | ICD-10-CM | POA: Diagnosis present

## 2021-06-28 LAB — BASIC METABOLIC PANEL
Anion gap: 7 (ref 5–15)
BUN: 19 mg/dL (ref 6–20)
CO2: 28 mmol/L (ref 22–32)
Calcium: 8.9 mg/dL (ref 8.9–10.3)
Chloride: 104 mmol/L (ref 98–111)
Creatinine, Ser: 1.35 mg/dL — ABNORMAL HIGH (ref 0.61–1.24)
GFR, Estimated: >60 mL/min (ref >60)
Glucose, Bld: 93 mg/dL (ref 70–99)
Potassium: 4.3 mmol/L (ref 3.5–5.1)
Sodium: 139 mmol/L (ref 135–145)

## 2021-06-28 LAB — CBC WITH DIFFERENTIAL/PLATELET
Abs Immature Granulocytes: 0.02 10*3/uL (ref 0.00–0.07)
Basophils Absolute: 0.1 10*3/uL (ref 0.0–0.1)
Basophils Relative: 2 %
Eosinophils Absolute: 0.4 10*3/uL (ref 0.0–0.5)
Eosinophils Relative: 5 %
HCT: 42.2 % (ref 39.0–52.0)
Hemoglobin: 13.6 g/dL (ref 13.0–17.0)
Immature Granulocytes: 0 %
Lymphocytes Relative: 46 %
Lymphs Abs: 3.3 10*3/uL (ref 0.7–4.0)
MCH: 28.8 pg (ref 26.0–34.0)
MCHC: 32.2 g/dL (ref 30.0–36.0)
MCV: 89.2 fL (ref 80.0–100.0)
Monocytes Absolute: 0.5 10*3/uL (ref 0.1–1.0)
Monocytes Relative: 7 %
Neutro Abs: 2.9 10*3/uL (ref 1.7–7.7)
Neutrophils Relative %: 40 %
Platelets: 325 10*3/uL (ref 150–400)
RBC: 4.73 MIL/uL (ref 4.22–5.81)
RDW: 14.6 % (ref 11.5–15.5)
WBC: 7.2 10*3/uL (ref 4.0–10.5)
nRBC: 0 % (ref 0.0–0.2)

## 2021-06-28 MED ORDER — IOHEXOL 300 MG/ML  SOLN
100.0000 mL | Freq: Once | INTRAMUSCULAR | Status: AC | PRN
  Administered 2021-06-28: 100 mL via INTRAVENOUS

## 2021-06-28 MED ORDER — KETOROLAC TROMETHAMINE 30 MG/ML IJ SOLN
15.0000 mg | Freq: Once | INTRAMUSCULAR | Status: AC
  Administered 2021-06-28: 15 mg via INTRAVENOUS
  Filled 2021-06-28: qty 1

## 2021-06-28 MED ORDER — SODIUM CHLORIDE 0.9 % IV BOLUS
1000.0000 mL | Freq: Once | INTRAVENOUS | Status: AC
  Administered 2021-06-28: 1000 mL via INTRAVENOUS

## 2021-06-28 MED ORDER — ONDANSETRON HCL 4 MG/2ML IJ SOLN
4.0000 mg | Freq: Once | INTRAMUSCULAR | Status: AC
  Administered 2021-06-28: 4 mg via INTRAVENOUS
  Filled 2021-06-28: qty 2

## 2021-06-28 NOTE — ED Triage Notes (Signed)
Pt presents to ER c/o RLQ abd pain that started yesterday and has become worse today.  Pt denies n/v/d.  Pt noted to have tenderness on palpation to area.  Pt is A&O x4 at this time and ambulatory to room.   ?

## 2021-06-29 MED ORDER — ONDANSETRON 4 MG PO TBDP: 4.0000 mg | ORAL_TABLET | Freq: Three times a day (TID) | ORAL | 0 refills | Status: DC | PRN

## 2021-06-29 MED ORDER — NAPROXEN 500 MG PO TABS: 500.0000 mg | ORAL_TABLET | Freq: Two times a day (BID) | ORAL | 0 refills | Status: DC

## 2021-06-29 NOTE — Discharge Instructions (Signed)
Your lab tests and CT scan were all normal.  Please continue taking anti-inflammatory medicine and nausea medicine as needed, and follow-up with primary care for continued evaluation of your symptoms. ?

## 2021-06-29 NOTE — ED Provider Notes (Signed)
? ?[facility] ?Provider Note ? ? ? Event Date/Time  ? First MD Initiated Contact with Patient 06/28/21 2305   ?  (approximate) ? ? ?History  ? ?Abdominal Pain ? ? ?HPI ? ?Bryan Walters is a 36 y.o. male with no significant past medical history who comes to the ED complaining of right lower quadrant abdominal pain that started yesterday, gradual onset, constant, waxing and waning, worsening.  No associated nausea vomiting or diarrhea.  No fever.  No trauma or other acute symptoms, no dysuria. ?  ? ? ?Physical Exam  ? ?Triage Vital Signs: ?ED Triage Vitals [06/28/21 2134]  ?Enc Vitals Group  ?   BP 131/87  ?   Pulse Rate 71  ?   Resp 18  ?   Temp 98.6 ?F (37 ?C)  ?   Temp Source Oral  ?   SpO2 100 %  ?   Weight 185 lb (83.9 kg)  ?   Height 5\' 6"  (1.676 m)  ?   Head Circumference   ?   Peak Flow   ?   Pain Score 8  ?   Pain Loc   ?   Pain Edu?   ?   Excl. in GC?   ? ? ?Most recent vital signs: ?Vitals:  ? 06/28/21 2134  ?BP: 131/87  ?Pulse: 71  ?Resp: 18  ?Temp: 98.6 ?F (37 ?C)  ?SpO2: 100%  ? ? ? ?General: Awake, no distress.  ?CV:  Good peripheral perfusion.  Regular rate and rhythm ?Resp:  Normal effort.  Clear to auscultation bilaterally ?Abd:  No distention.  Soft with focal right lower quadrant tenderness.  Not peritoneal. ?Other:  Moist oral mucosa ? ? ?ED Results / Procedures / Treatments  ? ?Labs ?(all labs ordered are listed, but only abnormal results are displayed) ?Labs Reviewed  ?BASIC METABOLIC PANEL - Abnormal; Notable for the following components:  ?    Result Value  ? Creatinine, Ser 1.35 (*)   ? All other components within normal limits  ?CBC WITH DIFFERENTIAL/PLATELET  ? ? ? ?EKG ? ? ? ? ?RADIOLOGY ?CT abdomen pelvis viewed and interpreted by me, no evidence of obstruction or intra-abdominal mass.  Radiology report reviewed which notes normal appendix. ? ? ? ?PROCEDURES: ? ?Critical Care performed: No ? ?Procedures ? ? ?MEDICATIONS ORDERED IN ED: ?Medications  ?sodium  chloride 0.9 % bolus 1,000 mL (1,000 mLs Intravenous New Bag/Given 06/28/21 2238)  ?ondansetron [facility]) injection 4 mg (4 mg Intravenous Given 06/28/21 2239)  ?ketorolac (TORADOL) 30 MG/ML injection 15 mg (15 mg Intravenous Given 06/28/21 2239)  ?iohexol (OMNIPAQUE) 300 MG/ML solution 100 mL (100 mLs Intravenous Contrast Given 06/28/21 2258)  ? ? ? ?IMPRESSION / MDM / ASSESSMENT AND PLAN / ED COURSE  ?I reviewed the triage vital signs and the nursing notes. ?             ?               ? ?Differential diagnosis includes, but is not limited to, appendicitis, viral illness, muscle strain ? ? ? ?Patient presents with focal right lower quadrant tenderness.  Eitel signs are normal, labs are normal.  CT scan is normal.  Given reassuring work-up, presentation is not consistent with appendicitis or other surgical pathology.  He stable for discharge home with continued supportive care with NSAIDs. ?  ? ? ?FINAL CLINICAL IMPRESSION(S) / ED DIAGNOSES  ? ?Final diagnoses:  ?Right lower quadrant abdominal pain  ? ? ? ?  Rx / DC Orders  ? ?ED Discharge Orders   ? ?      Ordered  ?  naproxen (NAPROSYN) 500 MG tablet  2 times daily with meals       ? 06/29/21 0006  ?  ondansetron (ZOFRAN-ODT) 4 MG disintegrating tablet  Every 8 hours PRN       ? 06/29/21 0006  ? ?  ?  ? ?  ? ? ? ?Note:  This document was prepared using Dragon voice recognition software and may include unintentional dictation errors. ?  ?Sharman Cheek, MD ?06/29/21 0011 ? ?

## 2021-10-10 ENCOUNTER — Inpatient Hospital Stay
Admission: AD | Admit: 2021-10-10 | Discharge: 2021-10-12 | DRG: 885 | Disposition: A | Discharge status: Home / Self Care | Payer: No Typology Code available for payment source | Source: Intra-hospital | Attending: Psychiatry | Admitting: Psychiatry

## 2021-10-10 ENCOUNTER — Encounter: Payer: Self-pay | Admitting: Emergency Medicine

## 2021-10-10 ENCOUNTER — Encounter: Payer: Self-pay | Admitting: Psychiatry

## 2021-10-10 ENCOUNTER — Emergency Department
Admission: EM | Admit: 2021-10-10 | Discharge: 2021-10-10 | Disposition: A | Discharge status: Other Acute Inpatient Hospital | Payer: No Typology Code available for payment source | Source: Home / Self Care | Attending: Emergency Medicine | Admitting: Emergency Medicine

## 2021-10-10 ENCOUNTER — Other Ambulatory Visit: Payer: Self-pay

## 2021-10-10 DIAGNOSIS — F17210 Nicotine dependence, cigarettes, uncomplicated: Secondary | ICD-10-CM | POA: Diagnosis present

## 2021-10-10 DIAGNOSIS — F1010 Alcohol abuse, uncomplicated: Secondary | ICD-10-CM | POA: Diagnosis present

## 2021-10-10 DIAGNOSIS — R45851 Suicidal ideations: Secondary | ICD-10-CM | POA: Diagnosis present

## 2021-10-10 DIAGNOSIS — Y900 Blood alcohol level of less than 20 mg/100 ml: Secondary | ICD-10-CM | POA: Insufficient documentation

## 2021-10-10 DIAGNOSIS — F331 Major depressive disorder, recurrent, moderate: Secondary | ICD-10-CM | POA: Diagnosis present

## 2021-10-10 DIAGNOSIS — Z20822 Contact with and (suspected) exposure to covid-19: Secondary | ICD-10-CM | POA: Insufficient documentation

## 2021-10-10 DIAGNOSIS — F410 Panic disorder [episodic paroxysmal anxiety] without agoraphobia: Secondary | ICD-10-CM | POA: Diagnosis present

## 2021-10-10 DIAGNOSIS — F1410 Cocaine abuse, uncomplicated: Secondary | ICD-10-CM | POA: Diagnosis present

## 2021-10-10 DIAGNOSIS — F32A Depression, unspecified: Secondary | ICD-10-CM

## 2021-10-10 DIAGNOSIS — F1290 Cannabis use, unspecified, uncomplicated: Secondary | ICD-10-CM | POA: Diagnosis present

## 2021-10-10 DIAGNOSIS — F329 Major depressive disorder, single episode, unspecified: Principal | ICD-10-CM | POA: Diagnosis present

## 2021-10-10 DIAGNOSIS — F1910 Other psychoactive substance abuse, uncomplicated: Secondary | ICD-10-CM

## 2021-10-10 DIAGNOSIS — Z886 Allergy status to analgesic agent status: Secondary | ICD-10-CM

## 2021-10-10 LAB — URINE DRUG SCREEN, QUALITATIVE (ARMC ONLY)
Amphetamines, Ur Screen: NOT DETECTED
Barbiturates, Ur Screen: NOT DETECTED
Benzodiazepine, Ur Scrn: NOT DETECTED
Cannabinoid 50 Ng, Ur ~~LOC~~: POSITIVE — AB
Cocaine Metabolite,Ur ~~LOC~~: POSITIVE — AB
MDMA (Ecstasy)Ur Screen: NOT DETECTED
Methadone Scn, Ur: NOT DETECTED
Opiate, Ur Screen: NOT DETECTED
Phencyclidine (PCP) Ur S: NOT DETECTED
Tricyclic, Ur Screen: NOT DETECTED

## 2021-10-10 LAB — ACETAMINOPHEN LEVEL: Acetaminophen (Tylenol), Serum: <10 ug/mL — ABNORMAL LOW (ref 10–30)

## 2021-10-10 LAB — SALICYLATE LEVEL: Salicylate Lvl: <7 mg/dL — ABNORMAL LOW (ref 7.0–30.0)

## 2021-10-10 LAB — CBC
HCT: 43.3 % (ref 39.0–52.0)
Hemoglobin: 14.3 g/dL (ref 13.0–17.0)
MCH: 28.9 pg (ref 26.0–34.0)
MCHC: 33 g/dL (ref 30.0–36.0)
MCV: 87.7 fL (ref 80.0–100.0)
Platelets: 356 10*3/uL (ref 150–400)
RBC: 4.94 MIL/uL (ref 4.22–5.81)
RDW: 14.4 % (ref 11.5–15.5)
WBC: 8.6 10*3/uL (ref 4.0–10.5)
nRBC: 0 % (ref 0.0–0.2)

## 2021-10-10 LAB — BASIC METABOLIC PANEL
Anion gap: 5 (ref 5–15)
BUN: 10 mg/dL (ref 6–20)
CO2: 23 mmol/L (ref 22–32)
Calcium: 9.1 mg/dL (ref 8.9–10.3)
Chloride: 110 mmol/L (ref 98–111)
Creatinine, Ser: 1.31 mg/dL — ABNORMAL HIGH (ref 0.61–1.24)
GFR, Estimated: >60 mL/min (ref >60)
Glucose, Bld: 83 mg/dL (ref 70–99)
Potassium: 3.6 mmol/L (ref 3.5–5.1)
Sodium: 138 mmol/L (ref 135–145)

## 2021-10-10 LAB — ETHANOL: Alcohol, Ethyl (B): 16 mg/dL — ABNORMAL HIGH (ref <10)

## 2021-10-10 LAB — SARS CORONAVIRUS 2 BY RT PCR: SARS Coronavirus 2 by RT PCR: NEGATIVE

## 2021-10-10 MED ORDER — GABAPENTIN 300 MG PO CAPS
300.0000 mg | ORAL_CAPSULE | Freq: Once | ORAL | Status: AC
  Administered 2021-10-10: 300 mg via ORAL
  Filled 2021-10-10: qty 1

## 2021-10-10 MED ORDER — ALUM & MAG HYDROXIDE-SIMETH 200-200-20 MG/5ML PO SUSP
30.0000 mL | ORAL | Status: DC | PRN
Start: 2021-10-10 — End: 2021-10-12

## 2021-10-10 MED ORDER — MAGNESIUM HYDROXIDE 400 MG/5ML PO SUSP: 30.0000 mL | Freq: Every day | ORAL | Status: DC | PRN

## 2021-10-10 MED ORDER — ACETAMINOPHEN 325 MG PO TABS
650.0000 mg | ORAL_TABLET | Freq: Four times a day (QID) | ORAL | Status: DC | PRN
  Administered 2021-10-12: 650 mg via ORAL
  Filled 2021-10-10: qty 2

## 2021-10-10 MED ORDER — HYDROXYZINE HCL 25 MG PO TABS
25.0000 mg | ORAL_TABLET | Freq: Three times a day (TID) | ORAL | Status: DC | PRN
Start: 2021-10-10 — End: 2021-10-10
  Administered 2021-10-10: 25 mg via ORAL
  Filled 2021-10-10: qty 1

## 2021-10-10 MED ORDER — HYDROXYZINE HCL 25 MG PO TABS
25.0000 mg | ORAL_TABLET | Freq: Three times a day (TID) | ORAL | Status: DC | PRN
Start: 2021-10-10 — End: 2021-10-12
  Administered 2021-10-11: 25 mg via ORAL
  Filled 2021-10-10 (×2): qty 1

## 2021-10-10 NOTE — ED Notes (Signed)
Report to Selena Batten, Charity fundraiser. COVID swab collected and sent to lab.

## 2021-10-10 NOTE — Progress Notes (Signed)
This Clinical research associate called NP regarding patient's high anxiety and that he had already received PRN Vistaril before being admitted to the unit. NP gave verbal order for 300 mg of Gabapentin Once.

## 2021-10-10 NOTE — Progress Notes (Signed)
Consents were obtained from patient during evening med pass. All questions/concerns were addressed and answered.

## 2021-10-10 NOTE — Plan of Care (Signed)
  Problem: Education: Goal: Knowledge of General Education information will improve Description: Including pain rating scale, medication(s)/side effects and non-pharmacologic comfort measures Outcome: Not Progressing   Problem: Health Behavior/Discharge Planning: Goal: Ability to manage health-related needs will improve Outcome: Not Progressing   Problem: Clinical Measurements: Goal: Ability to maintain clinical measurements within normal limits will improve Outcome: Not Progressing Goal: Will remain free from infection Outcome: Not Progressing Goal: Diagnostic test results will improve Outcome: Not Progressing Goal: Respiratory complications will improve Outcome: Not Progressing Goal: Cardiovascular complication will be avoided Outcome: Not Progressing   Problem: Activity: Goal: Risk for activity intolerance will decrease Outcome: Not Progressing   Problem: Nutrition: Goal: Adequate nutrition will be maintained Outcome: Not Progressing   Problem: Coping: Goal: Level of anxiety will decrease Outcome: Not Progressing   Problem: Elimination: Goal: Will not experience complications related to bowel motility Outcome: Not Progressing Goal: Will not experience complications related to urinary retention Outcome: Not Progressing   Problem: Pain Managment: Goal: General experience of comfort will improve Outcome: Not Progressing   Problem: Safety: Goal: Ability to remain free from injury will improve Outcome: Not Progressing   Problem: Skin Integrity: Goal: Risk for impaired skin integrity will decrease Outcome: Not Progressing   Problem: Education: Goal: Ability to state activities that reduce stress will improve Outcome: Not Progressing   Problem: Coping: Goal: Ability to identify and develop effective coping behavior will improve Outcome: Not Progressing   Problem: Self-Concept: Goal: Ability to identify factors that promote anxiety will improve Outcome: Not  Progressing Goal: Level of anxiety will decrease Outcome: Not Progressing Goal: Ability to modify response to factors that promote anxiety will improve Outcome: Not Progressing   Problem: Education: Goal: Knowledge of [facility] Education information/materials will improve Outcome: Not Progressing Goal: Emotional status will improve Outcome: Not Progressing Goal: Mental status will improve Outcome: Not Progressing Goal: Verbalization of understanding the information provided will improve Outcome: Not Progressing   Problem: Activity: Goal: Interest or engagement in activities will improve Outcome: Not Progressing Goal: Sleeping patterns will improve Outcome: Not Progressing   Problem: Coping: Goal: Ability to verbalize frustrations and anger appropriately will improve Outcome: Not Progressing Goal: Ability to demonstrate self-control will improve Outcome: Not Progressing   Problem: Health Behavior/Discharge Planning: Goal: Identification of resources available to assist in meeting health care needs will improve Outcome: Not Progressing Goal: Compliance with treatment plan for underlying cause of condition will improve Outcome: Not Progressing   Problem: Physical Regulation: Goal: Ability to maintain clinical measurements within normal limits will improve Outcome: Not Progressing   Problem: Safety: Goal: Periods of time without injury will increase Outcome: Not Progressing

## 2021-10-10 NOTE — Tx Team (Signed)
Initial Treatment Plan 10/10/2021 1:16 PM Bryan Walters FVC:[id number]    PATIENT STRESSORS: Financial difficulties   Marital or family conflict   Medication change or noncompliance   Substance abuse     PATIENT STRENGTHS: Ability for insight  Communication skills  General fund of knowledge  Motivation for treatment/growth    PATIENT IDENTIFIED PROBLEMS: Depression   Anxiety  Substance abuse  Medication noncompliance               DISCHARGE CRITERIA:  Ability to meet basic life and health needs Improved stabilization in mood, thinking, and/or behavior Need for constant or close observation no longer present Reduction of life-threatening or endangering symptoms to within safe limits  PRELIMINARY DISCHARGE PLAN: Outpatient therapy Placement in alternative living arrangements  PATIENT/FAMILY INVOLVEMENT: This treatment plan has been presented to and reviewed with the patient, Bryan Walters. The patient has been given the opportunity to ask questions and make suggestions.  Marquel Spoto, RN 10/10/2021, 1:16 PM

## 2021-10-10 NOTE — ED Notes (Signed)
Patient resting quietly in room. No noted distress or abnormal behaviors noted. Will continue 15 minute checks and observation by security camera for safety. 

## 2021-10-10 NOTE — ED Notes (Signed)
VOL/Pending Admit

## 2021-10-10 NOTE — ED Provider Notes (Signed)
[facility] Provider Note    Event Date/Time   First MD Initiated Contact with Patient 10/10/21 0231     (approximate)   History   Mental Health Problem   HPI Level 5 caveat: History is limited due to patient cooperation Bryan Walters is a 36 y.o. male with history of substance abuse and major depressive disorder who presents reporting suicidal ideation.  He said that he is having trouble with his family including maybe losing his kids and he wants to kill himself.  He admits to using cocaine.  He does not want to tell the story anymore but will talk to psychiatry.  He denies any medical complaints or concerns.     Physical Exam   ED Triage Vitals  Enc Vitals Group     BP 10/10/21 0333 119/75     Pulse Rate 10/10/21 0333 75     Resp 10/10/21 0333 16     Temp 10/10/21 0333 98.2 F (36.8 C)     Temp Source 10/10/21 0333 Oral     SpO2 10/10/21 0333 98 %     Weight 10/10/21 0203 81.6 kg (180 lb)     Height 10/10/21 0203 1.676 m (5\' 6" )     Head Circumference --      Peak Flow --      Pain Score 10/10/21 0203 0     Pain Loc --      Pain Edu? --      Excl. in GC? --      Most recent vital signs: Vitals:   10/10/21 0333  BP: 119/75  Pulse: 75  Resp: 16  Temp: 98.2 F (36.8 C)  SpO2: 98%     General: Awake, no distress.  CV:  Good peripheral perfusion.  Resp:  Normal effort.  Abd:  No distention.  Other:  Minimally cooperative but cries and seems to be in emotional distress when he talks about the situation.   ED Results / Procedures / Treatments   Labs (all labs ordered are listed, but only abnormal results are displayed) Labs Reviewed  BASIC METABOLIC PANEL - Abnormal; Notable for the following components:      Result Value   Creatinine, Ser 1.31 (*)    All other components within normal limits  ETHANOL - Abnormal; Notable for the following components:   Alcohol, Ethyl (B) 16 (*)    All other components within normal limits   SALICYLATE LEVEL - Abnormal; Notable for the following components:   Salicylate Lvl <7.0 (*)    All other components within normal limits  ACETAMINOPHEN LEVEL - Abnormal; Notable for the following components:   Acetaminophen (Tylenol), Serum <10 (*)    All other components within normal limits  URINE DRUG SCREEN, QUALITATIVE ([facility] ONLY) - Abnormal; Notable for the following components:   Cocaine Metabolite,Ur [place] POSITIVE (*)    Cannabinoid 50 Ng, Ur [place] POSITIVE (*)    All other components within normal limits  SARS CORONAVIRUS 2 BY RT PCR  CBC        IMPRESSION / MDM / ASSESSMENT AND PLAN / ED COURSE  I reviewed the triage vital signs and the nursing notes.                              Differential diagnosis includes, but is not limited to, depression, suicidal ideation, substance induced mood disorder.  Patient's presentation is most consistent with severe  exacerbation of chronic illness.  Patient has no medical complaints or concerns.  As per protocol, I ordered the following labs as part of the patient's medical and psychiatric evaluation:  CBC, CMP, ethanol level, acetaminophen level, salicylate level, urine drug screen, COVID swab.  No acute abnormalities on psych labs other than cocaine and cannabinoids on urine drug screen.  Consulting psychiatry for evaluation.  The patient has been placed in psychiatric observation due to the need to provide a safe environment for the patient while obtaining psychiatric consultation and evaluation, as well as ongoing medical and medication management to treat the patient's condition.  The patient has not been placed under full IVC at this time.    Clinical Course as of 10/10/21 0914  Tue Oct 10, 2021  4132 Discussed case in person with Annice Pih from psychiatry who consulted on the patient in the emergency department.  She said that they will admit the patient downstairs to the [facility] behavioral unit. [CF]    Clinical Course User  Index [CF] Loleta Rose, MD     FINAL CLINICAL IMPRESSION(S) / ED DIAGNOSES   Final diagnoses:  Depression, unspecified depression type  Drug abuse (HCC)     Rx / DC Orders   ED Discharge Orders     None        Note:  This document was prepared using Dragon voice recognition software and may include unintentional dictation errors.   Loleta Rose, MD 10/10/21 [phone]

## 2021-10-10 NOTE — ED Notes (Addendum)
With this nurse and EDT T Shawnie Dapper present, pt removes black flip flops, red short sleeve shirt, plaid shorts, black underwear, cell phone, --all placed in labeled pt belonging bag to be secured on nursing unit & pt changed into behav scrubs

## 2021-10-10 NOTE — ED Triage Notes (Signed)
Patient ambulatory to triage with steady gait, without difficulty or distress noted; pt reports depression for last 72mos., currently going thru a breakup and family issues; reports SI

## 2021-10-10 NOTE — Group Note (Unsigned)
LCSW Group Therapy Note   Group Date: 10/10/2021 Start Time: 1300 End Time: 1400   Type of Therapy and Topic:  Group Therapy: Boundaries  Participation Level:  {BHH PARTICIPATION IWPYK:99833}  Description of Group: This group will address the use of boundaries in their personal lives. Patients will explore why boundaries are important, the difference between healthy and unhealthy boundaries, and negative and postive outcomes of different boundaries and will look at how boundaries can be crossed.  Patients will be encouraged to identify current boundaries in their own lives and identify what kind of boundary is being set. Facilitators will guide patients in utilizing problem-solving interventions to address and correct types boundaries being used and to address when no boundary is being used. Understanding and applying boundaries will be explored and addressed for obtaining and maintaining a balanced life. Patients will be encouraged to explore ways to assertively make their boundaries and needs known to significant others in their lives, using other group members and facilitator for role play, support, and feedback.  Therapeutic Goals:  1.  Patient will identify areas in their life where setting clear boundaries could be  used to improve their life.  2.  Patient will identify signs/triggers that a boundary is not being respected. 3.  Patient will identify two ways to set boundaries in order to achieve balance in  their lives: 4.  Patient will demonstrate ability to communicate their needs and set boundaries  through discussion and/or role plays  Summary of Patient Progress:  *** was ***present/active throughout the session and proved open to feedback from CSW and peers. Patient demonstrated *** insight into the subject matter, was respectful of peers, and was present throughout the entire session.  Therapeutic Modalities:   Cognitive Behavioral Therapy Solution-Focused Therapy  Almedia Balls 10/10/2021  2:52 PM
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
## 2021-10-10 NOTE — ED Notes (Signed)
Pt discharged to BMU.  Voluntary consent has been signed.  VS stable.  1 belongings bag sent with patient.

## 2021-10-10 NOTE — ED Notes (Signed)
Patient has been accepted to [facility].  Patient assigned to room 320 Accepting physician is Dr. Gabriel Cirri, NP.  Call report to [phone].  Representative was Radio producer, Charity fundraiser.   ER Staff is aware of it:  Luann ER Secretary  Dr. Larinda Buttery, ER MD  Amy Patient's Nurse

## 2021-10-10 NOTE — BH Assessment (Addendum)
Comprehensive Clinical Assessment (CCA) Note  10/10/2021 Bryan Walters:[id number] Recommendations for Services/Supports/Treatments: Consulted with Lynder Parents., NP, who determined pt meets inpatient criteria. Notified Dr. Karma Greaser and Mitchell Heir, RN of disposition recommendation.   Bryan Walters is a 36 year old, English speaking, Black male with a self-reported hx of depression and anxiety. Pt also admitted to situational cocaine abuse, alcohol abuse, and cannabis abuse when having relationship conflict. Per triage note Patient ambulatory to triage with steady gait, without difficulty or distress noted; pt. reports depression for last 54mos., currently going thru a breakup and family issues; reports SI. Upon assessment Pt explained he'd presented to the hospital due to depression. Pt expressed that he has been trying to get his girlfriend to answer since arriving to the hospital. Pt reported that he is having relationship conflict with his girlfriend of 6 years. Pt reported a relationship pattern of his girlfriend accusing him of infidelity as an excuse to break up and him consequently resulting in substance use. Pt admitted that he is does not take psych medications. Pt admitted to using and unknown amount of alcohol and cannabis. Pt reported that he snorted an 8 ball of cocaine prior to arrival, but does not usually use substances. Pt did not express any readiness for substance abuse treatment and is in the precontemplation stage of change. Pt was cooperative and forthcoming throughout the assessment. Pt reported that he and his girlfriend just came back from [facility], [place] and were trying to stay with his mother for a while; however, his mother has been evicted. Pt had slurred speech and impaired judgment. Pt was oriented x4 and had adequate reality testing. Pt had depressed mood and a tearful affect. The pt. was visibly anxious, evidenced by him crying dramatically while rocking back and forth. The pt. did  not appear to be responding to internal/external stimuli nor did he present with delusional thinking. The patient denied current SI, HI or AV/H; however, he admitted to recently having thoughts of suicide. The pt. reported having a previous suicide attempt that involved holding a gun to his head, before being stopped by his brother from pulling the trigger. BAL 16; UDS + for cocaine and cannabis.  Chief Complaint:  Chief Complaint  Patient presents with   Mental Health Problem   Visit Diagnosis: MDD, recurrent episode, moderate Cocaine abuse Alcohol abuse    CCA Screening, Triage and Referral (STR)  Patient Reported Information How did you hear about [place]? Self  Referral name: No data recorded Referral phone number: No data recorded  Whom do you see for routine medical problems? No data recorded Practice/Facility Name: No data recorded Practice/Facility Phone Number: No data recorded Name of Contact: No data recorded Contact Number: No data recorded Contact Fax Number: No data recorded Prescriber Name: No data recorded Prescriber Address (if known): No data recorded  What Is the Reason for Your Visit/Call Today? Patient ambulatory to triage with steady gait, without difficulty or distress noted; pt reports depression for last 67mos., currently going thru a breakup and family issues; reports SI  How Long Has This Been Causing You Problems? <Week  What Do You Feel Would Help You the Most Today? Treatment for Depression or other mood problem   Have You Recently Been in Any Inpatient Treatment (Hospital/Detox/Crisis Center/28-Day Program)? No data recorded Name/Location of Program/Hospital:No data recorded How Long Were You There? No data recorded When Were You Discharged? No data recorded  Have You Ever Received Services From [facility] Before? No data  recorded Who Do You See at [facility]? No data recorded  Have You Recently Had Any Thoughts About Hurting Yourself? Yes  Are  You Planning to Commit Suicide/Harm Yourself At This time? No   Have you Recently Had Thoughts About [place]? No  Explanation: No data recorded  Have You Used Any Alcohol or Drugs in the Past 24 Hours? Yes  How Long Ago Did You Use Drugs or Alcohol? No data recorded What Did You Use and How Much? Pt reported that he'd snorted an 8 ball of cocaine; drank an unknown amount of alcohol; and smoked cannabis   Do You Currently Have a Therapist/Psychiatrist? No  Name of Therapist/Psychiatrist: No data recorded  Have You Been Recently Discharged From Any Office Practice or Programs? No  Explanation of Discharge From Practice/Program: No data recorded    CCA Screening Triage Referral Assessment Type of Contact: Face-to-Face  Is this Initial or Reassessment? No data recorded Date Telepsych consult ordered in [facility]:  No data recorded Time Telepsych consult ordered in [facility]:  No data recorded  Patient Reported Information Reviewed? No data recorded Patient Left Without Being Seen? No data recorded Reason for Not Completing Assessment: No data recorded  Collateral Involvement: None provided   Does Patient Have a [place]? No data recorded Name and Contact of Legal Guardian: No data recorded If Minor and Not Living with Parent(s), Who has Custody? n/a  Is CPS involved or ever been involved? Never  Is APS involved or ever been involved? Never   Patient Determined To Be At Risk for Harm To Self or Others Based on Review of Patient Reported Information or Presenting Complaint? No  Method: No data recorded Availability of Means: No data recorded Intent: No data recorded Notification Required: No data recorded Additional Information for Danger to Others Potential: No data recorded Additional Comments for Danger to Others Potential: No data recorded Are There Guns or Other Weapons in Your Home? No data recorded Types of Guns/Weapons: No data  recorded Are These Weapons Safely Secured?                            No data recorded Who Could Verify You Are Able To Have These Secured: No data recorded Do You Have any Outstanding Charges, Pending Court Dates, Parole/Probation? No data recorded Contacted To Inform of Risk of Harm To Self or Others: No data recorded  Location of Assessment: [facility]   Does Patient Present under Involuntary Commitment? No  IVC Papers Initial File Date: No data recorded  [place] of Residence: [place]   Patient Currently Receiving the Following Services: Not Receiving Services   Determination of Need: Emergent (2 hours)   Options For Referral: Inpatient Hospitalization     CCA Biopsychosocial Intake/Chief Complaint:  No data recorded Current Symptoms/Problems: No data recorded  Patient Reported Schizophrenia/Schizoaffective Diagnosis in Past: No   Strengths: Pt is able to ask for help; pt is physically healthy  Preferences: No data recorded Abilities: No data recorded  Type of Services Patient Feels are Needed: No data recorded  Initial Clinical Notes/Concerns: No data recorded  Mental Health Symptoms Depression:   Tearfulness; Worthlessness; Hopelessness   Duration of Depressive symptoms:  Less than two weeks   Mania:   None   Anxiety:    Worrying; Tension; Restlessness   Psychosis:   None   Duration of Psychotic symptoms: No data recorded  Trauma:   N/A  Obsessions:   Recurrent & persistent thoughts/impulses/images; Attempts to suppress/neutralize; Cause anxiety; Disrupts routine/functioning; Good insight   Compulsions:   "Driven" to perform behaviors/acts; Good insight; Intended to reduce stress or prevent another outcome; Intrusive/time consuming; Disrupts with routine/functioning; Repeated behaviors/mental acts   Inattention:   N/A   Hyperactivity/Impulsivity:   None   Oppositional/Defiant Behaviors:   N/A   Emotional Irregularity:    Intense/unstable relationships; Frantic efforts to avoid abandonment   Other Mood/Personality Symptoms:  No data recorded   Mental Status Exam Appearance and self-care  Stature:   Average   Weight:   Average weight   Clothing:   -- (In scrubs)   Grooming:   Normal   Cosmetic use:   None   Posture/gait:   Normal   Motor activity:   Restless   Sensorium  Attention:   Normal   Concentration:   Anxiety interferes   Orientation:   Place; Person; Object; Situation   Recall/memory:   Normal   Affect and Mood  Affect:   Tearful   Mood:   Depressed   Relating  Eye contact:   Normal   Facial expression:   Sad   Attitude toward examiner:   Cooperative   Thought and Language  Speech flow:  Slurred   Thought content:   Appropriate to Mood and Circumstances   Preoccupation:   Ruminations   Hallucinations:   None   Organization:  No data recorded  [employer] of Knowledge:   Average   Intelligence:   Average   Abstraction:   Normal   Judgement:   Poor   Reality Testing:   Adequate   Insight:   Present   Decision Making:   Impulsive   Social Functioning  Social Maturity:   Impulsive   Social Judgement:   Impropriety   Stress  Stressors:   Relationship; Family conflict; Housing   Coping Ability:   Exhausted   Skill Deficits:   Decision making; Responsibility   Supports:   Support needed     Religion: Religion/Spirituality Are You A Religious Person?:  (Not assessed) How Might This Affect Treatment?: Not assessed  Leisure/Recreation: Leisure / Recreation Do You Have Hobbies?:  (Not assessed)  Exercise/Diet: Exercise/Diet Do You Exercise?: No Have You Gained or Lost A Significant Amount of Weight in the Past Six Months?: No Do You Follow a Special Diet?: No Do You Have Any Trouble Sleeping?: Yes Explanation of Sleeping Difficulties: Pt reported that anxiety impacts his ability to  sleep.   CCA Employment/Education Employment/Work Situation: Employment / Work Situation Employment Situation: Unemployed Patient's Job has Been Impacted by Current Illness: No Has Patient ever Been in Equities trader?: No  Education: Education Is Patient Currently Attending School?: No Did Theme park manager?: No Did You Have An Individualized Education Program (IIEP): No Did You Have Any Difficulty At [employer]?: No Patient's Education Has Been Impacted by Current Illness: No   CCA Family/Childhood History Family and Relationship History: Family history Marital status: Long term relationship Long term relationship, how long?: 6 years What types of issues is patient dealing with in the relationship?: Pt reported that he is going through a breakup Additional relationship information: Pt reported that his partner has a pattern of acusing him of infidelity in order to get away from him. Does patient have children?: No  Childhood History:  Childhood History By whom was/is the patient raised?:  (Not assessed) Did patient suffer any verbal/emotional/physical/sexual abuse as a child?: No Did patient  suffer from severe childhood neglect?: No Has patient ever been sexually abused/assaulted/raped as an adolescent or adult?: No Was the patient ever a victim of a crime or a disaster?: No Witnessed domestic violence?: No Has patient been affected by domestic violence as an adult?: No  Child/Adolescent Assessment:     CCA Substance Use Alcohol/Drug Use: Alcohol / Drug Use Pain Medications: See MAR Prescriptions: See MAR Over the Counter: See MAR History of alcohol / drug use?: Yes Longest period of sobriety (when/how long): Unknown Negative Consequences of Use: Personal relationships Withdrawal Symptoms: None Substance #1 Name of Substance 1: Alcohol 1 - Amount (size/oz): Unknown 1 - Frequency: Pt reported that he does not usually use substances 1 - Duration: Ongoing 1 - Last  Use / Amount: Prior to arrival 10/10/21 Substance #2 Name of Substance 2: Cocaine 2 - Amount (size/oz): 8 ball 2 - Frequency: Pt reported that he does not usually use substances 2 - Duration: Ongoing 2 - Last Use / Amount: Prior to arrival 10/10/21 Substance #3 Name of Substance 3: Cannabis 3 - Amount (size/oz): Unknown 3 - Frequency: Pt reported that he does not usually use substances 3 - Duration: Ongoing 3 - Last Use / Amount: Prior to arrival 10/10/21                   ASAM's:  Six Dimensions of Multidimensional Assessment  Dimension 1:  Acute Intoxication and/or Withdrawal Potential:   Dimension 1:  Description of individual's past and current experiences of substance use and withdrawal: Pt reported that he only uses substances when he has relationship issues  Dimension 2:  Biomedical Conditions and Complications:   Dimension 2:  Description of patient's biomedical conditions and  complications: None  Dimension 3:  Emotional, Behavioral, or Cognitive Conditions and Complications:  Dimension 3:  Description of emotional, behavioral, or cognitive conditions and complications: Pt reported having a hx of depression/anxiety  Dimension 4:  Readiness to Change:     Dimension 5:  Relapse, Continued use, or Continued Problem Potential:     Dimension 6:  Recovery/Living Environment:     ASAM Severity Score: ASAM's Severity Rating Score: 11  ASAM Recommended Level of Treatment: ASAM Recommended Level of Treatment: Level I Outpatient Treatment   Substance use Disorder (SUD) Substance Use Disorder (SUD)  Checklist Symptoms of Substance Use: Continued use despite having a persistent/recurrent physical/psychological problem caused/exacerbated by use, Continued use despite persistent or recurrent social, interpersonal problems, caused or exacerbated by use  Recommendations for Services/Supports/Treatments: Recommendations for Services/Supports/Treatments Recommendations For  Services/Supports/Treatments: Individual Therapy  DSM5 Diagnoses: Patient Active Problem List   Diagnosis Date Noted   MDD (major depressive disorder), recurrent episode, moderate (HCC) 10/10/2021   Cocaine abuse (HCC) 10/10/2021   Alcohol abuse 10/10/2021   Ibn Stief R Raeghan Demeter, LCAS

## 2021-10-10 NOTE — ED Notes (Signed)
Breakfast tray given. °

## 2021-10-10 NOTE — ED Notes (Signed)
Report received from [facility]. Patient to be transferred to [facility] room 4.

## 2021-10-10 NOTE — ED Notes (Signed)
Psych at bedside.

## 2021-10-11 DIAGNOSIS — F331 Major depressive disorder, recurrent, moderate: Secondary | ICD-10-CM
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
## 2021-10-11 NOTE — Group Note (Signed)
[facility] LCSW Group Therapy Note   Group Date: 10/11/2021 Start Time: 1300 End Time: 1400   Type of Therapy/Topic:  Group Therapy:  Emotion Regulation  Participation Level:  Did Not Attend    Description of Group:    The purpose of this group is to assist patients in learning to regulate negative emotions and experience positive emotions. Patients will be guided to discuss ways in which they have been vulnerable to their negative emotions. These vulnerabilities will be juxtaposed with experiences of positive emotions or situations, and patients challenged to use positive emotions to combat negative ones. Special emphasis will be placed on coping with negative emotions in conflict situations, and patients will process healthy conflict resolution skills.  Therapeutic Goals: Patient will identify two positive emotions or experiences to reflect on in order to balance out negative emotions:  Patient will label two or more emotions that they find the most difficult to experience:  Patient will be able to demonstrate positive conflict resolution skills through discussion or role plays:   Summary of Patient Progress: Pt did not attend group despite invitation.   Therapeutic Modalities:   Cognitive Behavioral Therapy Feelings Identification Dialectical Behavioral Therapy   Alastair Hennes R Bunny Kleist, LCSW 

## 2021-10-11 NOTE — [facility] Group Notes (Signed)
[facility] Group Notes:  (Nursing/MHT/Case Management/Adjunct)  Date:  10/11/2021  Time:  9:12 AM  Type of Therapy:   Community Meeting  Participation Level:  Did Not Attend   Lynelle Smoke [facility] 10/11/2021, 9:12 AM
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
## 2021-10-11 NOTE — Plan of Care (Signed)
  Problem: Education: Goal: Knowledge of General Education information will improve Description: Including pain rating scale, medication(s)/side effects and non-pharmacologic comfort measures Outcome: Not Progressing   Problem: Health Behavior/Discharge Planning: Goal: Ability to manage health-related needs will improve Outcome: Not Progressing   Problem: Clinical Measurements: Goal: Ability to maintain clinical measurements within normal limits will improve Outcome: Not Progressing Goal: Will remain free from infection Outcome: Not Progressing Goal: Diagnostic test results will improve Outcome: Not Progressing Goal: Respiratory complications will improve Outcome: Not Progressing Goal: Cardiovascular complication will be avoided Outcome: Not Progressing   Problem: Safety: Goal: Periods of time without injury will increase Outcome: Not Progressing   Problem: Physical Regulation: Goal: Ability to maintain clinical measurements within normal limits will improve Outcome: Not Progressing   Problem: Health Behavior/Discharge Planning: Goal: Identification of resources available to assist in meeting health care needs will improve Outcome: Not Progressing Goal: Compliance with treatment plan for underlying cause of condition will improve Outcome: Not Progressing   

## 2021-10-11 NOTE — Progress Notes (Signed)
Recreation Therapy Notes  Date: 10/11/2021  Time: 10:15 am     Location: Craft room   Behavioral response: Appropriate  Intervention Topic:  Time Management    Discussion/Intervention:  Group content today was focused on time management. The group defined time management and identified healthy ways to manage time. Individuals expressed how much of the 24 hours they use in a day. Patients expressed how much time they use just for themselves personally. The group expressed how they have managed their time in the past. Individuals participated in the intervention "Managing Life" where they had a chance to see how much of the 24 hours they use and where it goes. Clinical Observations/Feedback: Patient came to group and expressed that he manages his time by scheduling and keeping up with things. Individual was social with peers and staff while participating in the intervention.    Roshan Roback LRT/CTRS         Emmanuell Kantz 10/11/2021 12:11 PM

## 2021-10-11 NOTE — BH IP Treatment Plan (Signed)
Interdisciplinary Treatment and Diagnostic Plan Update  10/11/2021 Time of Session: 9:30 AM Bryan Walters MRN: [id number]  Principal Diagnosis: MDD (major depressive disorder)  Secondary Diagnoses: Principal Problem:   MDD (major depressive disorder)   Current Medications:  Current Facility-Administered Medications  Medication Dose Route Frequency Provider Last Rate Last Admin   acetaminophen (TYLENOL) tablet 650 mg  650 mg Oral Q6H PRN Sherlon Handing, NP       alum & mag hydroxide-simeth (MAALOX/MYLANTA) 200-200-20 MG/5ML suspension 30 mL  30 mL Oral Q4H PRN Sherlon Handing, NP       hydrOXYzine (ATARAX) tablet 25 mg  25 mg Oral TID PRN Sherlon Handing, NP       magnesium hydroxide (MILK OF MAGNESIA) suspension 30 mL  30 mL Oral Daily PRN Sherlon Handing, NP       PTA Medications: Medications Prior to Admission  Medication Sig Dispense Refill Last Dose   cyclobenzaprine (FLEXERIL) 10 MG tablet Take 1 tablet (10 mg total) by mouth 3 (three) times daily as needed. (Patient not taking: Reported on 10/10/2021) 15 tablet 0    ibuprofen (ADVIL) 600 MG tablet Take 1 tablet (600 mg total) by mouth every 8 (eight) hours as needed. (Patient not taking: Reported on 10/10/2021) 15 tablet 0    naproxen (NAPROSYN) 500 MG tablet Take 1 tablet (500 mg total) by mouth 2 (two) times daily with a meal. (Patient not taking: Reported on 10/10/2021) 20 tablet 0    ondansetron (ZOFRAN-ODT) 4 MG disintegrating tablet Take 1 tablet (4 mg total) by mouth every 8 (eight) hours as needed for nausea or vomiting. 20 tablet 0     Patient Stressors: Financial difficulties   Marital or family conflict   Medication change or noncompliance   Substance abuse    Patient Strengths: Ability for insight  Marketing executive fund of knowledge  Motivation for treatment/growth   Treatment Modalities: Medication Management, Group therapy, Case management,  1 to 1 session with clinician,  Psychoeducation, Recreational therapy.   Physician Treatment Plan for Primary Diagnosis: MDD (major depressive disorder) Long Term Goal(s):     Short Term Goals:    Medication Management: Evaluate patient's response, side effects, and tolerance of medication regimen.  Therapeutic Interventions: 1 to 1 sessions, Unit Group sessions and Medication administration.  Evaluation of Outcomes: Not Met  Physician Treatment Plan for Secondary Diagnosis: Principal Problem:   MDD (major depressive disorder)  Long Term Goal(s):     Short Term Goals:       Medication Management: Evaluate patient's response, side effects, and tolerance of medication regimen.  Therapeutic Interventions: 1 to 1 sessions, Unit Group sessions and Medication administration.  Evaluation of Outcomes: Not Met   RN Treatment Plan for Primary Diagnosis: MDD (major depressive disorder) Long Term Goal(s): Knowledge of disease and therapeutic regimen to maintain health will improve  Short Term Goals: Ability to remain free from injury will improve, Ability to verbalize frustration and anger appropriately will improve, Ability to demonstrate self-control, Ability to participate in decision making will improve, Ability to verbalize feelings will improve, Ability to disclose and discuss suicidal ideas, Ability to identify and develop effective coping behaviors will improve, and Compliance with prescribed medications will improve  Medication Management: RN will administer medications as ordered by provider, will assess and evaluate patient's response and provide education to patient for prescribed medication. RN will report any adverse and/or side effects to prescribing provider.  Therapeutic Interventions: 1 on 1 counseling  sessions, Psychoeducation, Medication administration, Evaluate responses to treatment, Monitor vital signs and CBGs as ordered, Perform/monitor CIWA, COWS, AIMS and Fall Risk screenings as ordered, Perform  wound care treatments as ordered.  Evaluation of Outcomes: Not Met   LCSW Treatment Plan for Primary Diagnosis: MDD (major depressive disorder) Long Term Goal(s): Safe transition to appropriate next level of care at discharge, Engage patient in therapeutic group addressing interpersonal concerns.  Short Term Goals: Engage patient in aftercare planning with referrals and resources, Increase social support, Increase ability to appropriately verbalize feelings, Increase emotional regulation, Facilitate acceptance of mental health diagnosis and concerns, Facilitate patient progression through stages of change regarding substance use diagnoses and concerns, Identify triggers associated with mental health/substance abuse issues, and Increase skills for wellness and recovery  Therapeutic Interventions: Assess for all discharge needs, 1 to 1 time with Social worker, Explore available resources and support systems, Assess for adequacy in community support network, Educate family and significant other(s) on suicide prevention, Complete Psychosocial Assessment, Interpersonal group therapy.  Evaluation of Outcomes: Not Met   Progress in Treatment: Attending groups: No. Participating in groups: No. Taking medication as prescribed: Yes. Toleration medication: Yes. Family/Significant other contact made: No, will contact:  when given permission. Patient understands diagnosis: Yes. Discussing patient identified problems/goals with staff: Yes. Medical problems stabilized or resolved: Yes. Denies suicidal/homicidal ideation: Yes. Issues/concerns per patient self-inventory: No. Other: none.  New problem(s) identified: No, Describe:  none identified.  New Short Term/Long Term Goal(s): detox, elimination of symptoms of psychosis, medication management for mood stabilization; elimination of SI thoughts; development of comprehensive mental wellness/sobriety plan.  Patient Goals:  "I need to see my son. I  have not seen him in awhile."  Discharge Plan or Barriers: CSW will assist pt with development of an appropriate aftercare/discharge plan.   Reason for Continuation of Hospitalization: Medication stabilization Suicidal ideation  Estimated Length of Stay: 1-7 days  Last 3 [place] Suicide Severity Risk Score: [place] Admission (Current) from 10/10/2021 in [place] Most recent reading at 10/10/2021 12:00 PM ED from 10/10/2021 in [facility] Most recent reading at 10/10/2021  2:04 AM ED from 06/28/2021 in [facility] Most recent reading at 06/28/2021  9:36 PM  C-SSRS RISK CATEGORY No Risk High Risk No Risk       Last PHQ 2/9 Scores:     No data to display          Scribe for Treatment Team: Shirl Harris, LCSW 10/11/2021 10:09 AM
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
## 2021-10-11 NOTE — Plan of Care (Signed)
D: Pt alert and oriented. Pt rates depression 1/10, hopelessness 2/10, and anxiety 0/10. Pt goal: "Going home." Pt reports energy level as normal and concentration as being good. Pt reports sleep last night as being fair. Pt did not receive medications for sleep. Pt denies experiencing any pain at this time. Pt denies experiencing any SI/HI, or AVH at this time.   A: Scheduled medications administered to pt, per MD orders. Support and encouragement provided. Frequent verbal contact made. Routine safety checks conducted q15 minutes.   R: No adverse drug reactions noted. Pt verbally contracts for safety at this time. Pt compliant with medications. Pt interacts minimally with others on the unit, mostly self isolated to room with exception to meals. Pt remains safe at this time. Will continue to monitor.   Problem: Education: Goal: Knowledge of General Education information will improve Description: Including pain rating scale, medication(s)/side effects and non-pharmacologic comfort measures Outcome: Progressing   Problem: Nutrition: Goal: Adequate nutrition will be maintained Outcome: Progressing

## 2021-10-11 NOTE — Progress Notes (Signed)
Approached patient to inquire if he wanted any snacks before they were put away. Inquired if  he was going to need any assistance with sleep as he did not have any medication currently ordered.  Marrell was pleasant and  reserved.  Presented flat and depressed. Has been isolative to his room since the start of this shift. Declined the need for a sleep aid stating he didn't think he would have any problems sleeping.  Denies si hi avh at this encounter. Does endorse depression and anxiety. Will continue to offer encouragement and support.  Encouraged him to seek staff with any questions or concerns. 15 minute safety checks in place.    C Butler-Nicholson, LPN

## 2021-10-12 DIAGNOSIS — F331 Major depressive disorder, recurrent, moderate: Secondary | ICD-10-CM | POA: Diagnosis not present

## 2021-10-12 MED ORDER — HYDROXYZINE HCL 25 MG PO TABS: 25.0000 mg | ORAL_TABLET | Freq: Three times a day (TID) | ORAL | 0 refills | Status: AC | PRN

## 2021-10-12 NOTE — [facility] Group Notes (Signed)
[facility] Group Notes:  (Nursing/MHT/Case Management/Adjunct)  Date:  10/12/2021  Time:  10:42 AM  Type of Therapy:   Community Meeting  Participation Level:  Minimal  Participation Quality:  Appropriate  Affect:  Appropriate  Cognitive:  Appropriate  Insight:  Appropriate  Engagement in Group:  Limited  Modes of Intervention:  Discussion, Education, and Support  Summary of Progress/Problems: Patient left group early  [employer] Bryan Walters 10/12/2021, 10:42 AM
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
## 2021-10-12 NOTE — [facility] Suicide Risk Assessment (Signed)
[facility] INPATIENT:  Family/Significant Other Suicide Prevention Education  Suicide Prevention Education:  Patient Refusal for Family/Significant Other Suicide Prevention Education: The patient Bryan Walters has refused to provide written consent for family/significant other to be provided Family/Significant Other Suicide Prevention Education during admission and/or prior to discharge.  Physician notified.  SPE completed with pt, as pt refused to consent to family contact. SPI pamphlet provided to pt and pt was encouraged to share information with support network, ask questions, and talk about any concerns relating to SPE. Pt denies access to guns/firearms and verbalized understanding of information provided. Mobile Crisis information also provided to pt.  Glenis Smoker 10/12/2021, 10:06 AM

## 2021-10-12 NOTE — Progress Notes (Signed)
D: Pt alert and oriented. Pt denies experiencing any pain, SI/HI, or AVH at this time. Pt reports he will be able to keep himself safe when he returns home.   A: Pt received discharge and medication education/information. Pt belongings were returned and signed for at this time.   R: Pt verbalized understanding of discharge and medication education/information.  Pt escorted by staff to the medical mall front lobby where pt was picked up by his friends.

## 2021-10-12 NOTE — Progress Notes (Signed)
  [facility] Adult Case Management Discharge Plan :  Will you be returning to the same living situation after discharge:  Yes,  pt plans to return to his mother's home. At discharge, do you have transportation home?: Yes,  family to provide transportation. Do you have the ability to pay for your medications: Yes,  [employer].  Release of information consent forms completed and in the chart;  Patient's signature needed at discharge.  Patient to Follow up at:  Follow-up Information     [facility], [place]. Follow up.   Why: Hours: Monday-Friday 9AM-5PM  Follow up with your peer support specialist as per stated plans. Thanks! Contact information: [address] [place], [place] [address] Phone: [phone] Fax: [phone]                Next level of care provider has access to [facility] Link:no  Safety Planning and Suicide Prevention discussed: Yes,  SPE completed with pt.     Has patient been referred to the Quitline?: Patient refused referral  Patient has been referred for addiction treatment: Pt. refused referral  Glenis Smoker, LCSW 10/12/2021, 10:05 AM

## 2021-10-12 NOTE — Progress Notes (Signed)
Recreation Therapy Notes   Date: 10/12/2021   Time: 10:20 am    Location: Courtyard   Behavioral response: Appropriate  Intervention Topic:  Wellness    Discussion/Intervention:  Group content today was focused on Wellness. The group defined wellness and some positive ways they make decisions for themselves. Individuals expressed reasons why they neglected any wellness in the past. Patients described ways to improve wellness skills in the future. The group explained what could happen if they did not do any wellness at all. Participants express how bad choices has affected them and others around them. Individual explained the importance of wellness. The group participated in the intervention "Testing my Wellness" where they had a chance to identify some of their weaknesses and strengths in wellness.  Clinical Observations/Feedback: Patient came to group and was isolated to himself.      Aliviya Schoeller LRT/CTRS          Clementina Mareno 10/12/2021 12:41 PM

## 2021-10-12 NOTE — Plan of Care (Signed)
D: Pt alert and oriented. Pt denies experiencing any depression at this time however, endorses feeling anxiousness related to discharging. Pt did receive medications for sleep and did find them helpful. Pt reports experiencing 9/10 Left hip pain r/t the mattress and sleeping on his side at this time, prn medications given. Pt denies experiencing any SI/HI, or AVH at this time.   A: Scheduled medications administered to pt, per MD orders. Support and encouragement provided. Frequent verbal contact made. Routine safety checks conducted q15 minutes.   R: No adverse drug reactions noted. Pt verbally contracts for safety at this time. Pt compliant with medications. Pt interacts minimally with others on the unit, mostly self isolating to his room with exception to meals and medication needs. Pt remains safe at this time. Will continue to monitor.   Problem: Education: Goal: Knowledge of General Education information will improve Description: Including pain rating scale, medication(s)/side effects and non-pharmacologic comfort measures Outcome: Progressing   Problem: Nutrition: Goal: Adequate nutrition will be maintained Outcome: Progressing

## 2021-10-12 NOTE — Plan of Care (Signed)
  Problem: Education: Goal: Knowledge of General Education information will improve Description: Including pain rating scale, medication(s)/side effects and non-pharmacologic comfort measures Outcome: Progressing   Problem: Health Behavior/Discharge Planning: Goal: Ability to manage health-related needs will improve Outcome: Progressing   Problem: Clinical Measurements: Goal: Ability to maintain clinical measurements within normal limits will improve Outcome: Progressing Goal: Will remain free from infection Outcome: Progressing Goal: Diagnostic test results will improve Outcome: Progressing Goal: Respiratory complications will improve Outcome: Progressing Goal: Cardiovascular complication will be avoided Outcome: Progressing   Problem: Safety: Goal: Periods of time without injury will increase Outcome: Progressing   Problem: Physical Regulation: Goal: Ability to maintain clinical measurements within normal limits will improve Outcome: Progressing   Problem: Health Behavior/Discharge Planning: Goal: Identification of resources available to assist in meeting health care needs will improve Outcome: Progressing Goal: Compliance with treatment plan for underlying cause of condition will improve Outcome: Progressing   Problem: Coping: Goal: Ability to verbalize frustrations and anger appropriately will improve Outcome: Progressing Goal: Ability to demonstrate self-control will improve Outcome: Progressing   

## 2021-10-12 NOTE — [facility] Suicide Risk Assessment (Signed)
Suicide Risk Assessment  [facility] Discharge Suicide Risk Assessment   Principal Problem: MDD (major depressive disorder), recurrent episode, moderate ([facility]) Discharge Diagnoses: Principal Problem:   MDD (major depressive disorder), recurrent episode, moderate ([facility]) Active Problems:   Cocaine abuse ([facility])   Alcohol abuse   Total Time spent with patient: 45 minutes  Musculoskeletal: Strength & Muscle Tone: within normal limits Gait & Station: normal Patient leans: N/A  Psychiatric Specialty Exam: Physical Exam Vitals and nursing note reviewed.  Constitutional:      Appearance: Normal appearance.  HENT:     Head: Normocephalic.     Nose: Nose normal.  Pulmonary:     Effort: Pulmonary effort is normal.  Musculoskeletal:        General: Normal range of motion.     Cervical back: Normal range of motion.  Neurological:     General: No focal deficit present.     Mental Status: He is alert and oriented to person, place, and time.  Psychiatric:        Attention and Perception: Attention and perception normal.        Mood and Affect: Mood is anxious.        Speech: Speech normal.        Behavior: Behavior normal. Behavior is cooperative.        Thought Content: Thought content normal.        Cognition and Memory: Cognition and memory normal.        Judgment: Judgment normal.     Review of Systems  Psychiatric/Behavioral:  Positive for substance abuse. The patient is nervous/anxious.   All other systems reviewed and are negative.   Blood pressure (!) 131/99, pulse 74, temperature 98.6 F (37 C), temperature source Oral, resp. rate 18, height 5' 8.11" (1.73 m), weight 77.6 kg, SpO2 100 %.Body mass index is 25.92 kg/m.  General Appearance: Casual  Eye Contact:  Good  Speech:  Normal Rate  Volume:  Normal  Mood:  Anxious  Affect:  Congruent  Thought Process:  Coherent  Orientation:  Full (Time, Place, and Person)  Thought Content:  WDL and Logical  Suicidal Thoughts:  No   Homicidal Thoughts:  No  Memory:  Immediate;   Good Recent;   Good Remote;   Good  Judgement:  Fair  Insight:  Fair  Psychomotor Activity:  Normal  Concentration:  Concentration: Good and Attention Span: Good  Recall:  Good  Fund of Knowledge:  Fair  Language:  Good  Akathisia:  No  Handed:  Right  AIMS (if indicated):     Assets:  Housing Leisure Time Physical Health Resilience Social Support  ADL's:  Intact  Cognition:  WNL  Sleep:        Physical Exam: Physical Exam Vitals and nursing note reviewed.  Constitutional:      Appearance: Normal appearance.  HENT:     Head: Normocephalic.     Nose: Nose normal.  Pulmonary:     Effort: Pulmonary effort is normal.  Musculoskeletal:        General: Normal range of motion.     Cervical back: Normal range of motion.  Neurological:     General: No focal deficit present.     Mental Status: He is alert and oriented to person, place, and time.  Psychiatric:        Attention and Perception: Attention and perception normal.        Mood and Affect: Mood is anxious.  Speech: Speech normal.        Behavior: Behavior normal. Behavior is cooperative.        Thought Content: Thought content normal.        Cognition and Memory: Cognition and memory normal.        Judgment: Judgment normal.    Review of Systems  Psychiatric/Behavioral:  Positive for substance abuse. The patient is nervous/anxious.   All other systems reviewed and are negative.  Blood pressure (!) 131/99, pulse 74, temperature 98.6 F (37 C), temperature source Oral, resp. rate 18, height 5' 8.11" (1.73 m), weight 77.6 kg, SpO2 100 %. Body mass index is 25.92 kg/m.  Mental Status Per Nursing Assessment::   On Admission:  NA  Demographic Factors:  Male and Adolescent or young adult  Loss Factors: NA  Historical Factors: NA  Risk Reduction Factors:   Sense of responsibility to family, Living with another person, especially a relative, Positive  social support, and Positive therapeutic relationship  Continued Clinical Symptoms:  Anxiety, mild  Cognitive Features That Contribute To Risk:  None    Suicide Risk:  Minimal: No identifiable suicidal ideation.  Patients presenting with no risk factors but with morbid ruminations; may be classified as minimal risk based on the severity of the depressive symptoms   Plan Of Care/Follow-up recommendations:  Activity:  as tolerated Diet:  heart healthy diet Major depressive disorder, recurrent, moderate: Declined medications Follow up with Peer Support team  Anxiety: Hydroxyzine 25 mg TID PRN  Nanine Means, NP 10/12/2021, 8:54 AM

## 2022-05-31 ENCOUNTER — Ambulatory Visit: Payer: Medicaid Other

## 2022-07-05 ENCOUNTER — Encounter: Payer: Self-pay | Admitting: Family

## 2022-07-05 ENCOUNTER — Ambulatory Visit: Payer: Medicaid Other | Admitting: Family

## 2022-07-05 DIAGNOSIS — Z113 Encounter for screening for infections with a predominantly sexual mode of transmission: Secondary | ICD-10-CM | POA: Diagnosis not present

## 2022-07-05 DIAGNOSIS — Z202 Contact with and (suspected) exposure to infections with a predominantly sexual mode of transmission: Secondary | ICD-10-CM

## 2022-07-05 LAB — HEPATITIS B SURFACE ANTIGEN: Hepatitis B Surface Ag: NONREACTIVE

## 2022-07-05 LAB — HM HIV SCREENING LAB: HM HIV Screening: NEGATIVE

## 2022-07-05 LAB — HM HEPATITIS C SCREENING LAB: HM Hepatitis Screen: NEGATIVE

## 2022-07-05 MED ORDER — DOXYCYCLINE HYCLATE 100 MG PO TABS: 100.0000 mg | ORAL_TABLET | Freq: Two times a day (BID) | ORAL | 0 refills | Status: AC

## 2022-07-05 MED ORDER — CEFTRIAXONE SODIUM 500 MG IJ SOLR
500.0000 mg | Freq: Once | INTRAMUSCULAR | Status: AC
  Administered 2022-07-05: 500 mg via INTRAMUSCULAR

## 2022-07-05 NOTE — Progress Notes (Signed)
[facility] Department STI clinic/screening visit  Subjective:  Bryan Walters is a 37 y.o. male being seen today for an STI screening visit. The patient reports they do not have symptoms.    Patient has the following medical conditions:   Patient Active Problem List   Diagnosis Date Noted   MDD (major depressive disorder), recurrent episode, moderate ([place]) 10/10/2021   Cocaine abuse ([place]) 10/10/2021   Alcohol abuse 10/10/2021     Chief Complaint  Patient presents with   SEXUALLY TRANSMITTED DISEASE    Screening.  Contact to STD    HPI  Patient reports   Last HIV test per patient/review of record was No results found for: "HMHIVSCREEN" No results found for: "HIV"  Does the patient or their partner desires a pregnancy in the next year? Yes, partner is currently pregnant  Screening for MPX risk: Does the patient have an unexplained rash? No Is the patient MSM? No Does the patient endorse multiple sex partners or anonymous sex partners? No Did the patient have close or sexual contact with a person diagnosed with MPX? No Has the patient traveled outside the [place] where MPX is endemic? No Is there a high clinical suspicion for MPX-- evidenced by one of the following No  -Unlikely to be chickenpox  -Lymphadenopathy  -Rash that present in same phase of evolution on any given body part   See flowsheet for further details and programmatic requirements.    There is no immunization history on file for this patient.   The following portions of the patient's history were reviewed and updated as appropriate: allergies, current medications, past medical history, past social history, past surgical history and problem list.  Objective:  There were no vitals filed for this visit.  Physical Exam Constitutional:      Appearance: Normal appearance.  HENT:     Head: Normocephalic and atraumatic.     Comments: No nits or hair loss    Mouth/Throat:     Mouth: Mucous  membranes are moist. No oral lesions.     Pharynx: Oropharynx is clear. No oropharyngeal exudate or posterior oropharyngeal erythema.  Eyes:     General:        Right eye: No discharge.        Left eye: No discharge.     Conjunctiva/sclera:     Right eye: Right conjunctiva is not injected. No exudate.    Left eye: Left conjunctiva is not injected. No exudate. Pulmonary:     Effort: Pulmonary effort is normal.  Abdominal:     General: Abdomen is flat.     Palpations: Abdomen is soft. There is no hepatomegaly or mass.     Tenderness: There is no abdominal tenderness. There is no rebound.     Hernia: There is no hernia in the left inguinal area or right inguinal area.  Genitourinary:    Pubic Area: No rash or pubic lice (no nits).      Penis: Normal. No tenderness, discharge, swelling or lesions.      Testes: Normal.     Epididymis:     Right: Normal. No mass or tenderness.     Left: Normal. No mass or tenderness.     Rectum: Normal. No tenderness (no lesions or discharge).  Lymphadenopathy:     Head:     Right side of head: No preauricular or posterior auricular adenopathy.     Left side of head: No preauricular or posterior auricular adenopathy.  Cervical: No cervical adenopathy.     Upper Body:     Right upper body: No supraclavicular, axillary or epitrochlear adenopathy.     Left upper body: No supraclavicular, axillary or epitrochlear adenopathy.     Lower Body: No right inguinal adenopathy. No left inguinal adenopathy.  Skin:    General: Skin is warm and dry.     Findings: No lesion or rash.  Neurological:     Mental Status: He is alert and oriented to person, place, and time.       Assessment and Plan:  Bryan Walters is a 37 y.o. male presenting to the [facility] Department for STI screening  1. Screening for venereal disease Will contact if positive results Use condoms for all sex  - Chlamydia/GC NAA, Confirmation - HIV [facility] -  Syphilis Serology, [place] [facility] -Gonococcus culture pharyngeal  2. Gonorrhea contact  Rocephine 500mg  IM today in clinic Doxycycline 100mg  PO BID x 7 days, #14 Partner currently pregnant and treated in Maternity clinic Abstain from sex until 7 days after self and partner treatment completed  - cefTRIAXone (ROCEPHIN) injection 500 mg   Patient does not have STI symptoms Patient accepted all screenings including  urine GC/Chlamydia, and blood work for HIV/Syphilis. Patient meets criteria for HepB screening? Yes. Ordered? yes Patient meets criteria for HepC screening? Yes. Ordered? yes Recommended condom use with all sex Discussed importance of condom use for STI prevent  Treat positive test results per standing order. Discussed time line for [facility] results and that patient will be called with positive results and encouraged patient to call if he had not heard in 2 weeks Recommended repeat testing in 3 months with positive results. Recommended returning for continued or worsening symptoms.   No follow-ups on file.  No future appointments.  Marline Backbone, FNP

## 2022-07-05 NOTE — Progress Notes (Signed)
Pt  is here for STD screening and as a contact to Gonorrhea.  Ceftriaxone 500 mg given IM in left deltoid.  Pt tolerated well.  The patient was dispensed Doxycycline 100 mg #14 today. I provided counseling today regarding the medication. We discussed the medication, the side effects and when to call clinic. Patient given the opportunity to ask questions. Questions answered.  Condoms given.  Windle Guard, RN

## 2022-07-08 LAB — CHLAMYDIA/GC NAA, CONFIRMATION
Chlamydia trachomatis, NAA: NEGATIVE
Neisseria gonorrhoeae, NAA: NEGATIVE

## 2022-07-10 LAB — GONOCOCCUS CULTURE

## 2022-10-14 ENCOUNTER — Emergency Department
Admission: EM | Admit: 2022-10-14 | Discharge: 2022-10-14 | Disposition: A | Discharge status: Home / Self Care | Payer: MEDICAID | Attending: Emergency Medicine | Admitting: Emergency Medicine

## 2022-10-14 ENCOUNTER — Emergency Department: Payer: MEDICAID

## 2022-10-14 ENCOUNTER — Other Ambulatory Visit: Payer: Self-pay

## 2022-10-14 DIAGNOSIS — R1033 Periumbilical pain: Secondary | ICD-10-CM | POA: Insufficient documentation

## 2022-10-14 DIAGNOSIS — F14929 Cocaine use, unspecified with intoxication, unspecified: Secondary | ICD-10-CM

## 2022-10-14 DIAGNOSIS — N179 Acute kidney failure, unspecified: Secondary | ICD-10-CM | POA: Diagnosis not present

## 2022-10-14 DIAGNOSIS — T148XXA Other injury of unspecified body region, initial encounter: Secondary | ICD-10-CM

## 2022-10-14 DIAGNOSIS — F14220 Cocaine dependence with intoxication, uncomplicated: Secondary | ICD-10-CM | POA: Diagnosis not present

## 2022-10-14 DIAGNOSIS — T796XXA Traumatic ischemia of muscle, initial encounter: Secondary | ICD-10-CM | POA: Diagnosis not present

## 2022-10-14 DIAGNOSIS — S59912A Unspecified injury of left forearm, initial encounter: Secondary | ICD-10-CM | POA: Diagnosis present

## 2022-10-14 DIAGNOSIS — S50812A Abrasion of left forearm, initial encounter: Secondary | ICD-10-CM | POA: Diagnosis not present

## 2022-10-14 LAB — BASIC METABOLIC PANEL
Anion gap: 19 — ABNORMAL HIGH (ref 5–15)
BUN: 19 mg/dL (ref 6–20)
CO2: 16 mmol/L — ABNORMAL LOW (ref 22–32)
Calcium: 9.6 mg/dL (ref 8.9–10.3)
Chloride: 100 mmol/L (ref 98–111)
Creatinine, Ser: 2 mg/dL — ABNORMAL HIGH (ref 0.61–1.24)
GFR, Estimated: 44 mL/min — ABNORMAL LOW (ref >60)
Glucose, Bld: 197 mg/dL — ABNORMAL HIGH (ref 70–99)
Potassium: 5.3 mmol/L — ABNORMAL HIGH (ref 3.5–5.1)
Sodium: 135 mmol/L (ref 135–145)

## 2022-10-14 LAB — CK
Total CK: 420 U/L — ABNORMAL HIGH (ref 49–397)
Total CK: 410 U/L — ABNORMAL HIGH (ref 49–397)

## 2022-10-14 LAB — CBC
HCT: 47.6 % (ref 39.0–52.0)
Hemoglobin: 15.5 g/dL (ref 13.0–17.0)
MCH: 29 pg (ref 26.0–34.0)
MCHC: 32.6 g/dL (ref 30.0–36.0)
MCV: 89 fL (ref 80.0–100.0)
Platelets: 355 10*3/uL (ref 150–400)
RBC: 5.35 MIL/uL (ref 4.22–5.81)
RDW: 14.4 % (ref 11.5–15.5)
WBC: 10.5 10*3/uL (ref 4.0–10.5)
nRBC: 0 % (ref 0.0–0.2)

## 2022-10-14 LAB — BASIC METABOLIC PANEL WITH GFR
Anion gap: 9 (ref 5–15)
BUN: 17 mg/dL (ref 6–20)
CO2: 23 mmol/L (ref 22–32)
Calcium: 8.7 mg/dL — ABNORMAL LOW (ref 8.9–10.3)
Chloride: 104 mmol/L (ref 98–111)
Creatinine, Ser: 1.48 mg/dL — ABNORMAL HIGH (ref 0.61–1.24)
GFR, Estimated: >60 mL/min
Glucose, Bld: 83 mg/dL (ref 70–99)
Potassium: 3.8 mmol/L (ref 3.5–5.1)
Sodium: 136 mmol/L (ref 135–145)

## 2022-10-14 LAB — URINALYSIS, ROUTINE W REFLEX MICROSCOPIC
Bilirubin Urine: NEGATIVE
Glucose, UA: NEGATIVE mg/dL
Hgb urine dipstick: NEGATIVE
Ketones, ur: NEGATIVE mg/dL
Leukocytes,Ua: NEGATIVE
Nitrite: NEGATIVE
Protein, ur: NEGATIVE mg/dL
Specific Gravity, Urine: 1.019 (ref 1.005–1.030)
pH: 6 (ref 5.0–8.0)

## 2022-10-14 LAB — ETHANOL: Alcohol, Ethyl (B): <10 mg/dL

## 2022-10-14 LAB — TROPONIN I (HIGH SENSITIVITY)
Troponin I (High Sensitivity): 4 ng/L (ref <18)
Troponin I (High Sensitivity): 5 ng/L (ref <18)

## 2022-10-14 MED ORDER — LACTATED RINGERS IV BOLUS
1000.0000 mL | Freq: Once | INTRAVENOUS | Status: AC
  Administered 2022-10-14: 1000 mL via INTRAVENOUS

## 2022-10-14 MED ORDER — BACITRACIN ZINC 500 UNIT/GM EX OINT
TOPICAL_OINTMENT | Freq: Once | CUTANEOUS | Status: AC
  Administered 2022-10-14: 2 via TOPICAL
  Filled 2022-10-14: qty 0.9

## 2022-10-14 MED ORDER — IOHEXOL 350 MG/ML SOLN
75.0000 mL | Freq: Once | INTRAVENOUS | Status: AC | PRN
  Administered 2022-10-14: 75 mL via INTRAVENOUS

## 2022-10-14 MED ORDER — MIDAZOLAM HCL 2 MG/2ML IJ SOLN
4.0000 mg | Freq: Once | INTRAMUSCULAR | Status: AC
  Administered 2022-10-14: 4 mg via INTRAVENOUS
  Filled 2022-10-14: qty 4

## 2022-10-14 MED ORDER — KETOROLAC TROMETHAMINE 30 MG/ML IJ SOLN
15.0000 mg | Freq: Once | INTRAMUSCULAR | Status: AC
  Administered 2022-10-14: 15 mg via INTRAVENOUS
  Filled 2022-10-14: qty 1

## 2022-10-14 NOTE — Discharge Instructions (Signed)
Please take Tylenol and ibuprofen/Advil for your pain.  It is safe to take them together, or to alternate them every few hours.  Take up to 1000mg  of Tylenol at a time, up to 4 times per day.  Do not take more than 4000 mg of Tylenol in 24 hours.  For ibuprofen, take 400-600 mg, 3 - 4 times per day.  Wash his abrasions with soap and water twice per day and again if they get dirty.  Cover with bacitracin, Neosporin, other antibiotic ointment or even just Vaseline to help with healing and preventing infection

## 2022-10-14 NOTE — ED Notes (Signed)
Pt is crying in triage.

## 2022-10-14 NOTE — ED Notes (Signed)
Went over d/c paperwork at this time with patient. Pt had no questions, comments or concerns after review and verbally understood them.  
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
## 2022-12-06 ENCOUNTER — Other Ambulatory Visit: Payer: Self-pay

## 2022-12-06 ENCOUNTER — Observation Stay
Admission: EM | Admit: 2022-12-06 | Discharge: 2022-12-07 | Discharge status: Against Medical Advice | Payer: MEDICAID | Attending: Internal Medicine | Admitting: Internal Medicine

## 2022-12-06 ENCOUNTER — Emergency Department: Payer: MEDICAID

## 2022-12-06 DIAGNOSIS — R251 Tremor, unspecified: Secondary | ICD-10-CM

## 2022-12-06 DIAGNOSIS — F17210 Nicotine dependence, cigarettes, uncomplicated: Secondary | ICD-10-CM | POA: Diagnosis not present

## 2022-12-06 DIAGNOSIS — F1920 Other psychoactive substance dependence, uncomplicated: Secondary | ICD-10-CM | POA: Diagnosis not present

## 2022-12-06 DIAGNOSIS — Z7982 Long term (current) use of aspirin: Secondary | ICD-10-CM | POA: Diagnosis not present

## 2022-12-06 DIAGNOSIS — R569 Unspecified convulsions: Secondary | ICD-10-CM | POA: Diagnosis not present

## 2022-12-06 DIAGNOSIS — F1010 Alcohol abuse, uncomplicated: Secondary | ICD-10-CM | POA: Diagnosis not present

## 2022-12-06 DIAGNOSIS — M6282 Rhabdomyolysis: Principal | ICD-10-CM

## 2022-12-06 DIAGNOSIS — N179 Acute kidney failure, unspecified: Secondary | ICD-10-CM | POA: Diagnosis not present

## 2022-12-06 DIAGNOSIS — F1910 Other psychoactive substance abuse, uncomplicated: Secondary | ICD-10-CM | POA: Insufficient documentation

## 2022-12-06 LAB — CBC
HCT: 44.9 % (ref 39.0–52.0)
Hemoglobin: 15.2 g/dL (ref 13.0–17.0)
MCH: 29.4 pg (ref 26.0–34.0)
MCHC: 33.9 g/dL (ref 30.0–36.0)
MCV: 86.8 fL (ref 80.0–100.0)
Platelets: 349 10*3/uL (ref 150–400)
RBC: 5.17 MIL/uL (ref 4.22–5.81)
RDW: 14.6 % (ref 11.5–15.5)
WBC: 10.8 10*3/uL — ABNORMAL HIGH (ref 4.0–10.5)
nRBC: 0 % (ref 0.0–0.2)

## 2022-12-06 LAB — BASIC METABOLIC PANEL
Anion gap: 12 (ref 5–15)
BUN: 22 mg/dL — ABNORMAL HIGH (ref 6–20)
CO2: 23 mmol/L (ref 22–32)
Calcium: 9.7 mg/dL (ref 8.9–10.3)
Chloride: 101 mmol/L (ref 98–111)
Creatinine, Ser: 1.48 mg/dL — ABNORMAL HIGH (ref 0.61–1.24)
GFR, Estimated: >60 mL/min (ref >60)
Glucose, Bld: 97 mg/dL (ref 70–99)
Potassium: 4.2 mmol/L (ref 3.5–5.1)
Sodium: 136 mmol/L (ref 135–145)

## 2022-12-06 LAB — CK: Total CK: 4877 U/L — ABNORMAL HIGH (ref 49–397)

## 2022-12-06 MED ORDER — HYDROCODONE-ACETAMINOPHEN 5-325 MG PO TABS: 1.0000 | ORAL_TABLET | ORAL | Status: DC | PRN

## 2022-12-06 MED ORDER — SODIUM CHLORIDE 0.9 % IV SOLN: Freq: Once | INTRAVENOUS | Status: AC

## 2022-12-06 MED ORDER — FENTANYL CITRATE PF 50 MCG/ML IJ SOSY
50.0000 ug | PREFILLED_SYRINGE | Freq: Once | INTRAMUSCULAR | Status: AC
  Administered 2022-12-06: 50 ug via INTRAVENOUS
  Filled 2022-12-06: qty 1

## 2022-12-06 MED ORDER — ENOXAPARIN SODIUM 40 MG/0.4ML IJ SOSY
40.0000 mg | PREFILLED_SYRINGE | INTRAMUSCULAR | Status: DC
  Administered 2022-12-06: 40 mg via SUBCUTANEOUS
  Filled 2022-12-06: qty 0.4

## 2022-12-06 MED ORDER — ONDANSETRON HCL 4 MG PO TABS: 4.0000 mg | ORAL_TABLET | Freq: Four times a day (QID) | ORAL | Status: DC | PRN

## 2022-12-06 MED ORDER — SODIUM CHLORIDE 0.9 % IV BOLUS
1000.0000 mL | Freq: Once | INTRAVENOUS | Status: AC
  Administered 2022-12-06: 1000 mL via INTRAVENOUS

## 2022-12-06 MED ORDER — LORAZEPAM 2 MG/ML IJ SOLN: 0.5000 mg | INTRAMUSCULAR | Status: DC | PRN

## 2022-12-06 MED ORDER — LORAZEPAM 2 MG/ML IJ SOLN
1.0000 mg | Freq: Once | INTRAMUSCULAR | Status: AC
  Administered 2022-12-06: 1 mg via INTRAVENOUS
  Filled 2022-12-06: qty 1

## 2022-12-06 MED ORDER — ACETAMINOPHEN 325 MG PO TABS: 650.0000 mg | ORAL_TABLET | Freq: Four times a day (QID) | ORAL | Status: DC | PRN

## 2022-12-06 MED ORDER — ONDANSETRON HCL 4 MG/2ML IJ SOLN: 4.0000 mg | Freq: Four times a day (QID) | INTRAMUSCULAR | Status: DC | PRN

## 2022-12-06 MED ORDER — ACETAMINOPHEN 650 MG RE SUPP: 650.0000 mg | Freq: Four times a day (QID) | RECTAL | Status: DC | PRN

## 2022-12-06 NOTE — Assessment & Plan Note (Signed)
Creatinine 1.48 baseline unknown Expecting improvement with IV hydration

## 2022-12-06 NOTE — ED Provider Notes (Signed)
[facility] Provider Note    Event Date/Time   First MD Initiated Contact with Patient 12/06/22 1845     (approximate)  History   Chief Complaint: Drug Problem  HPI  Bryan Walters is a 37 y.o. male with no significant past medical history who presents to the emergency department for possible seizures/shaking.  According to the patient he states he was drinking alcohol last night and used ecstasy and cocaine.  Patient states he is never used ecstasy in the past.  He states today he has been not feeling well describes cramping and pain throughout his whole body.  States he was at home and he felt like his body was "locking up" and shaking.  Patient states he was using cocaine and alcohol in ecstasy from 9 PM last night until early this morning.  States he has not slept.  Denies any attempt to hurt himself, states he was just hanging out with friends.  Physical Exam   Triage Vital Signs: ED Triage Vitals [12/06/22 1758]  Encounter Vitals Group     BP (!) 138/96     Systolic BP Percentile      Diastolic BP Percentile      Pulse Rate 93     Resp 18     Temp 98.6 F (37 C)     Temp src      SpO2 97 %     Weight 185 lb (83.9 kg)     Height 5\' 6"  (1.676 m)     Head Circumference      Peak Flow      Pain Score 6     Pain Loc      Pain Education      Exclude from Growth Chart     Most recent vital signs: Vitals:   12/06/22 1758  BP: (!) 138/96  Pulse: 93  Resp: 18  Temp: 98.6 F (37 C)  SpO2: 97%    General: Awake, moderately anxious appearing, mild hyperventilation. CV:  Good peripheral perfusion.  Regular rate and rhythm  Resp:  Mild tachypnea/hyperventilation with deeper breaths.  No wheeze rales rhonchi Abd:  No distention.  Soft, nontender.  No rebound or guarding.  ED Results / Procedures / Treatments   EKG  EKG viewed and interpreted by myself shows a normal sinus rhythm at 86 bpm with a narrow QRS, normal axis, normal intervals,  nonspecific ST changes.  RADIOLOGY  I have reviewed and interpreted CT head images.  No bleed seen on my evaluation. Radiology has read the CT scan is negative.   MEDICATIONS ORDERED IN ED: Medications  sodium chloride 0.9 % bolus 1,000 mL (has no administration in time range)  LORazepam (ATIVAN) injection 1 mg (has no administration in time range)     IMPRESSION / MDM / ASSESSMENT AND PLAN / ED COURSE  I reviewed the triage vital signs and the nursing notes.  Patient's presentation is most consistent with acute presentation with potential threat to life or bodily function.  Patient presents to the emergency department after using cocaine ecstasy and alcohol overnight last night into the early morning.  Patient states his family told him he was shaking or jerking.  Patient states he was awake during this and felt like his body was locking up.  Patient appears anxious he is hyperventilating.  Patient's workup today shows reassuring vital signs with just mild hypertension.  CT scan is reassuring of his head.  Lab work shows a normal CBC,  reassuring chemistry of mild renal insufficiency.  We will place an IV, IV hydrate the patient, we will dose Ativan to help with the anxiety type symptoms likely as a result of the stimulant.  EKG is reassuring.  I have added on a CK as a precaution.  Patient CK has resulted elevated 4877.  We will dose an additional liter of fluid start the patient on normal saline infusion.  Will admit to the hospital service for rhabdomyolysis.  FINAL CLINICAL IMPRESSION(S) / ED DIAGNOSES   Anxiety Substance use disorder Rhabdomyolysis   Note:  This document was prepared using Dragon voice recognition software and may include unintentional dictation errors.   Minna Antis, MD 12/06/22 2135

## 2022-12-06 NOTE — Assessment & Plan Note (Addendum)
CK 4877 and no history of falls Likely induced by use of cocaine and ecstasy Expecting improvement with hydration

## 2022-12-06 NOTE — Assessment & Plan Note (Addendum)
Polysubstance abuse Patient counseled on abstinence Follow-up UDS

## 2022-12-06 NOTE — ED Notes (Signed)
Patient to room 3 from waiting room. Seizure pads placed onto stretcher. Socks provided at patient request. Patient is awake, alert, oriented, and stable at this time. Awaiting provider eval for further.

## 2022-12-06 NOTE — H&P (Signed)
History and Physical    Patient: Bryan Walters FAO:[id number] DOB: 01-18-1986 DOA: 12/06/2022 DOS: the patient was seen and examined on 12/06/2022 [facility]  Patient coming from: Home  Chief Complaint:  Chief Complaint  Patient presents with   Drug Problem    HPI: Bryan Walters is a 37 y.o. male with no significant past medical history who presents to the ED for evaluation of shaking and tremulousness following a night of drinking and drug use.  Patient used cocaine, ecstasy and drinking and until about 4 AM and since then he could not sleep and has been jittery and shaking.  States he has never used ecstasy before.  He has had no nausea, vomiting, no loss of consciousness.  Feels like his body is cramping and " locking up" and shaking.  Denies nausea, vomiting, headache or visual disturbance, fever or chills, chest pain or shortness of breath. ED course and data reviewed: Vitals within normal limits.  Labs notable for creatinine of 1.48, WBC 10.8 and CK 4877. UDS pending EKG, personally viewed and interpreted showing NSR at 86 CT head normal Patient was treated with a fluid bolus, Zofran, lorazepam and hydrocodone Hospitalist consulted for admission.   Review of Systems: As mentioned in the history of present illness. All other systems reviewed and are negative.  Past Medical History:  Diagnosis Date   Cocaine abuse (HCC)    reported by patient   Panic attacks    reported by patient   Past Surgical History:  Procedure Laterality Date   REFRACTIVE SURGERY     Social History:  reports that he has been smoking cigarettes. He has never used smokeless tobacco. He reports current alcohol use. He reports current drug use. Drugs: Marijuana and Cocaine.  Allergies  Allergen Reactions   Aspirin     History reviewed. No pertinent family history.  Prior to Admission medications   Not on File    Physical Exam: Vitals:   12/06/22 1758  12/06/22 1930 12/06/22 2000 12/06/22 2154  BP: (!) 138/96 133/84 127/62 132/88  Pulse: 93 86 84 88  Resp: 18   18  Temp: 98.6 F (37 C)   98.4 F (36.9 C)  TempSrc:    Oral  SpO2: 97% 100% 99% 98%  Weight: 83.9 kg     Height: 5\' 6"  (1.676 m)      Physical Exam Vitals and nursing note reviewed.  Constitutional:      General: He is not in acute distress. HENT:     Head: Normocephalic and atraumatic.  Cardiovascular:     Rate and Rhythm: Normal rate and regular rhythm.     Heart sounds: Normal heart sounds.  Pulmonary:     Effort: Pulmonary effort is normal.     Breath sounds: Normal breath sounds.  Abdominal:     Palpations: Abdomen is soft.     Tenderness: There is no abdominal tenderness.  Neurological:     Mental Status: Mental status is at baseline.     Labs on Admission: I have personally reviewed following labs and imaging studies  CBC: Recent Labs  Lab 12/06/22 1805  WBC 10.8*  HGB 15.2  HCT 44.9  MCV 86.8  PLT 349   Basic Metabolic Panel: Recent Labs  Lab 12/06/22 1805  NA 136  K 4.2  CL 101  CO2 23  GLUCOSE 97  BUN 22*  CREATININE 1.48*  CALCIUM 9.7   GFR: Estimated Creatinine Clearance: 70.7 mL/min (A) (  by C-G formula based on SCr of 1.48 mg/dL (H)). Liver Function Tests: No results for input(s): "AST", "ALT", "ALKPHOS", "BILITOT", "PROT", "ALBUMIN" in the last 168 hours. No results for input(s): "LIPASE", "AMYLASE" in the last 168 hours. No results for input(s): "AMMONIA" in the last 168 hours. Coagulation Profile: No results for input(s): "INR", "PROTIME" in the last 168 hours. Cardiac Enzymes: Recent Labs  Lab 12/06/22 1805  CKTOTAL 4,877*   BNP (last 3 results) No results for input(s): "PROBNP" in the last 8760 hours. HbA1C: No results for input(s): "HGBA1C" in the last 72 hours. CBG: No results for input(s): "GLUCAP" in the last 168 hours. Lipid Profile: No results for input(s): "CHOL", "HDL", "LDLCALC", "TRIG", "CHOLHDL",  "LDLDIRECT" in the last 72 hours. Thyroid Function Tests: No results for input(s): "TSH", "T4TOTAL", "FREET4", "T3FREE", "THYROIDAB" in the last 72 hours. Anemia Panel: No results for input(s): "VITAMINB12", "FOLATE", "FERRITIN", "TIBC", "IRON", "RETICCTPCT" in the last 72 hours. Urine analysis:    Component Value Date/Time   COLORURINE STRAW (A) 10/14/2022 0504   APPEARANCEUR CLEAR (A) 10/14/2022 0504   LABSPEC 1.019 10/14/2022 0504   PHURINE 6.0 10/14/2022 0504   GLUCOSEU NEGATIVE 10/14/2022 0504   HGBUR NEGATIVE 10/14/2022 0504   BILIRUBINUR NEGATIVE 10/14/2022 0504   KETONESUR NEGATIVE 10/14/2022 0504   PROTEINUR NEGATIVE 10/14/2022 0504   NITRITE NEGATIVE 10/14/2022 0504   LEUKOCYTESUR NEGATIVE 10/14/2022 0504    Radiological Exams on Admission: CT HEAD WO CONTRAST ( )  Result Date: 12/06/2022 CLINICAL DATA:  Status post seizure. EXAM: CT HEAD WITHOUT CONTRAST TECHNIQUE: Contiguous axial images were obtained from the base of the skull through the vertex without intravenous contrast. RADIATION DOSE REDUCTION: This exam was performed according to the departmental dose-optimization program which includes automated exposure control, adjustment of the mA and/or kV according to patient size and/or use of iterative reconstruction technique. COMPARISON:  September 14, 2022 FINDINGS: Brain: No evidence of acute infarction, hemorrhage, hydrocephalus, extra-axial collection or mass lesion/mass effect. Vascular: No hyperdense vessel or unexpected calcification. Skull: Normal. Negative for fracture or focal lesion. Sinuses/Orbits: No acute finding. Other: None. IMPRESSION: Normal head CT. Electronically Signed   By: Aram Candela M.D.   On: 12/06/2022 18:27     Data Reviewed: Relevant notes from primary care and specialist visits, past discharge summaries as available in EHR, including Care Everywhere. Prior diagnostic testing as pertinent to current admission diagnoses Updated medications and  problem lists for reconciliation ED course, including vitals, labs, imaging, treatment and response to treatment Triage notes, nursing and pharmacy notes and ED provider's notes Notable results as noted in HPI   Assessment and Plan: * Non-traumatic rhabdomyolysis CK 4877 and no history of falls Likely induced by use of cocaine and ecstasy Expecting improvement with hydration  Shakiness Appears to be related to substance abuse.  Does not appear to be seizures Neurologic checks IV hydration  AKI (acute kidney injury) (HCC) Creatinine 1.48 baseline unknown Expecting improvement with IV hydration  Alcohol abuse Polysubstance abuse Patient counseled on abstinence Follow-up UDS        DVT prophylaxis: Lovenox  Consults: none  Advance Care Planning:   Code Status: Full Code   Family Communication: none  Disposition Plan: Back to previous home environment  Severity of Illness: The appropriate patient status for this patient is OBSERVATION. Observation status is judged to be reasonable and necessary in order to provide the required intensity of service to ensure the patient's safety. The patient's presenting symptoms, physical exam findings, and initial radiographic  and laboratory data in the context of their medical condition is felt to place them at decreased risk for further clinical deterioration. Furthermore, it is anticipated that the patient will be medically stable for discharge from the hospital within 2 midnights of admission.   Author: Andris Baumann, MD 12/06/2022 10:31 PM  For on call review [email or web address].

## 2022-12-06 NOTE — ED Notes (Signed)
Patient called out for assistance.  He wanted to know how much longer before he got a room.  He stated that if he didn't get a room soon, he would be leaving.  Primary nurse and provider have been notified.

## 2022-12-06 NOTE — ED Triage Notes (Signed)
Pt to ED for "seizures" today. Reports took 5 ecstasy pills and 2 bags of coke from last night to 0600 this am. Pt reports he now feels sleepy.  Reports whole body pain Alert and oriented, clear spech.

## 2022-12-06 NOTE — Assessment & Plan Note (Signed)
Appears to be related to substance abuse.  Does not appear to be seizures Neurologic checks IV hydration

## 2022-12-06 NOTE — ED Notes (Signed)
Patient ambulatory to restroom and back to ER stretcher at this time. Reconnected to IVF. Medicated for pain. UA sent.

## 2022-12-07 ENCOUNTER — Other Ambulatory Visit: Payer: Self-pay

## 2022-12-07 DIAGNOSIS — F17210 Nicotine dependence, cigarettes, uncomplicated: Secondary | ICD-10-CM | POA: Insufficient documentation

## 2022-12-07 DIAGNOSIS — R251 Tremor, unspecified: Secondary | ICD-10-CM | POA: Insufficient documentation

## 2022-12-07 DIAGNOSIS — N179 Acute kidney failure, unspecified: Secondary | ICD-10-CM | POA: Insufficient documentation

## 2022-12-07 DIAGNOSIS — M6282 Rhabdomyolysis: Principal | ICD-10-CM | POA: Insufficient documentation

## 2022-12-07 DIAGNOSIS — Z7982 Long term (current) use of aspirin: Secondary | ICD-10-CM | POA: Insufficient documentation

## 2022-12-07 LAB — URINE DRUG SCREEN, QUALITATIVE (ARMC ONLY)
Amphetamines, Ur Screen: POSITIVE — AB
Barbiturates, Ur Screen: NOT DETECTED
Benzodiazepine, Ur Scrn: NOT DETECTED
Cannabinoid 50 Ng, Ur ~~LOC~~: POSITIVE — AB
Cocaine Metabolite,Ur ~~LOC~~: POSITIVE — AB
MDMA (Ecstasy)Ur Screen: NOT DETECTED
Methadone Scn, Ur: NOT DETECTED
Opiate, Ur Screen: NOT DETECTED
Phencyclidine (PCP) Ur S: NOT DETECTED
Tricyclic, Ur Screen: NOT DETECTED

## 2022-12-07 LAB — URINALYSIS, COMPLETE (UACMP) WITH MICROSCOPIC
Bacteria, UA: NONE SEEN
Bilirubin Urine: NEGATIVE
Glucose, UA: NEGATIVE mg/dL
Hgb urine dipstick: NEGATIVE
Ketones, ur: NEGATIVE mg/dL
Leukocytes,Ua: NEGATIVE
Nitrite: NEGATIVE
Protein, ur: NEGATIVE mg/dL
Specific Gravity, Urine: 1.013 (ref 1.005–1.030)
Squamous Epithelial / HPF: NONE SEEN /HPF (ref 0–5)
pH: 5 (ref 5.0–8.0)

## 2022-12-07 LAB — BASIC METABOLIC PANEL
Anion gap: 9 (ref 5–15)
BUN: 16 mg/dL (ref 6–20)
CO2: 18 mmol/L — ABNORMAL LOW (ref 22–32)
Calcium: 8.4 mg/dL — ABNORMAL LOW (ref 8.9–10.3)
Chloride: 108 mmol/L (ref 98–111)
Creatinine, Ser: 1.36 mg/dL — ABNORMAL HIGH (ref 0.61–1.24)
GFR, Estimated: >60 mL/min (ref >60)
Glucose, Bld: 88 mg/dL (ref 70–99)
Potassium: 4 mmol/L (ref 3.5–5.1)
Sodium: 135 mmol/L (ref 135–145)

## 2022-12-07 LAB — CK: Total CK: 3331 U/L — ABNORMAL HIGH (ref 49–397)

## 2022-12-07 LAB — HIV ANTIBODY (ROUTINE TESTING W REFLEX): HIV Screen 4th Generation wRfx: NONREACTIVE

## 2022-12-07 MED ORDER — SODIUM CHLORIDE 0.9 % IV SOLN: INTRAVENOUS | Status: DC

## 2022-12-07 MED ORDER — SODIUM CHLORIDE 0.9 % IV BOLUS (SEPSIS)
1000.0000 mL | Freq: Once | INTRAVENOUS | Status: AC
  Administered 2022-12-08: 1000 mL via INTRAVENOUS

## 2022-12-07 NOTE — ED Triage Notes (Signed)
Pt reports generalized body aches pt reports he was seen for the same last night. Pt talks in complete sentences no respiratory distress noted.

## 2022-12-07 NOTE — ED Notes (Signed)
First nurse note: Pt from home via [facility] c/o abd pain. Pt here earlier for same but left AMA

## 2022-12-07 NOTE — Discharge Instructions (Signed)
Food Resources  Agency Name: [facility] Address: [address] [place], [place] [address] Phone: [phone] Website: [email or web address] Service(s) Offered: Housing services, self-sufficiency, congregate meal program, weatherization program, Event organiser program, emergency food assistance,  housing counseling, home ownership program, wheels - to work program.  [employer] free for 60 and older at various locations from [employer], Monday-Friday:  [employer], [address] [place], [phone] -[facility], [address], [place] [phone]  -[facility], [address], [place] [phone]  -[address], [address], [place], [phone]  Agency Name: [facility] [place] [facility] Address: [phone] [address], [address], [place], [place] [address] Phone: [phone] Website: [email or web address] Service(s) Offered: Home delivered hot, frozen, and emergency  meals. Grocery assistance program which matches  volunteers one-on-one with seniors unable to grocery shop  for themselves. Must be 60 years and older; less than 20  hours of in-home aide service, limited or no driving ability;  live alone or with someone with a disability; live in  [place].  Agency Name: [employer] [employer]) Address: [address] [place], [place] [address] Phone: [phone] Service(s) Offered: Food is served to shut-ins, homeless, elderly, and low income people in the community every Saturday (11:30 am-12:30 pm) and Sunday (12:30 pm-1:30pm). Volunteers also offer help and encouragement in seeking employment,  and spiritual guidance.  Agency Name: [employer] Address: [address] [place], [place] [address] Phone: [phone] Service(s) Offered: Child support services; child welfare services; food stamps; Medicaid; work first family assistance; and aid  with fuel,  rent, food and medicine.  Agency Name: [employer] Address: [address], [place], [place] Phone: [phone] Website: www.dreamalign.com Services Offered: Monday 10:00am-12:00, 8:00pm-9:00pm, and Friday 10:00am-12:00.  Agency Name: [employer] Address: [address], [place], [place] [address] Phone: [phone] Website: [email or web address] Service(s) Offered: Serves weekday meals, open from 11:30 am- 1:00 pm., and 6:30-7:30pm, Monday-Wednesday-Friday distributes food 3:30-6pm, Monday-Wednesday-Friday.  Agency Name: [facility] Address: [address] [place], [place] Phone: [phone] Website: [email or web address] Services Offered: Distributes food the 4th Saturday of the month, starting at 8:00 am  Agency Name: [facility] Address: [phone] [address] [place], [place] [address] Phone: [phone] Website: [email or web address] Service(s) Offered: Bread of life, weekly food pantry. Open Wednesdays from 10:00am-noon.  Agency Name: [facility] [employer] Address: [address] [place], [place], [place] Phone: [phone] Services Offered: Distributes food 9am-1pm, Monday-Thursday. Call for details.  Agency Name: [facility] Address: [address], [place], [place] [address] Phone: [phone] Website: [email or web address] Service(s) Offered: Games developer. Call for assistance.  Agency Name: [facility] Address: [address] [place], [place] [address] Phone: [phone] Service Offered: Emergency Food Pantry. Call for appointment.  Agency Name: [facility] Address: [address], [place], [place] [address] Phone: [phone] Website: [email or web address] Services Offered: Games developer. Call for details  Agency Name: [facility] Address: [address] [place], [place] Phone:  [phone] Website: [email or web address] Service(s) Offered: Emergency Food Pantry. Call for details.  Agency Name: Holiday representative Address: [address] [place], [place] [address] Phone: [phone] or [phone] Website: [email or web address] Service(s) Offered: Distribute food 9am-11:30 am, Tuesday-Friday, and 1-3:30pm, Monday-Friday. Food pantry Monday-Friday 1pm-3pm, fresh items, Mon.-Wed.-Fri.  Agency Name: [facility] Address: [address] [place], [place] [address] Phone: [phone] Website: www.[email or web address] Services Offered: Distribute food Tues and Sats from 9:00am-noon.  Closed 1st Saturday of each month. Call for details

## 2022-12-07 NOTE — TOC CM/SW Note (Signed)
Patient flagged for SDOH food resources. Resources have been added to AVS.  Alfonso Ramus, LCSW Transitions of Care Department [phone]

## 2022-12-07 NOTE — Plan of Care (Signed)

## 2022-12-07 NOTE — Progress Notes (Signed)
Patient admitted to the unit and a skin assessment was completed by myself and Danyell RN. The patient has scabbed areas to the left/right elbow, left/right knee, first two toes on left foot have scratches, left forearm has scratches as well, and right hand has some scratches in the palm area and outer area. Patient placed on telemetry. No other skin issues noted at this time.

## 2022-12-07 NOTE — Discharge Summary (Signed)
Physician Discharge Summary  Bryan Walters:[id number] DOB: 1985-04-16 DOA: 12/06/2022  [facility]  Admit date: 12/06/2022 Discharge date: 12/07/2022  Admitted From: Home  Discharge disposition: Left AGAINST MEDICAL ADVICE   Recommendations for Outpatient Follow-Up:   Left AGAINST MEDICAL ADVICE.  Advised him to the hospital for worsening symptoms or follow-up with primary care provider.   Discharge Diagnosis:   Principal Problem:   Non-traumatic rhabdomyolysis Active Problems:   AKI (acute kidney injury) (HCC)   Shakiness   Alcohol abuse   Polysubstance abuse [facility])    Discharge Condition: Left AGAINST MEDICAL ADVICE  Diet recommendation: Regular Wound care: None.  Code status: Full.   History of Present Illness:   Bryan Walters is a 37 y.o. male with no significant past medical history presented to hospital with shaking tremulous after a night of drinking and drug abuse with cocaine and ecstasy.  Stated that he never used ecstasy before.  He felt like his body was cramping and locking up.  In the ED, vitals were stable.  Creatinine was elevated at 1.4 with CK of 4877.  Urine drug screens was positive for amphetamine, cocaine and cannabis.  EKG showed normal sinus rhythm.  CT head was negative for acute findings.  Patient was given IV fluid bolus Zofran Ativan hydrocodone and was admitted hospital for further evaluation and treatment.  Hospital Course:   Following conditions were addressed during hospitalization as listed below,  * Non-traumatic rhabdomyolysis CK 4877 and no history of falls, likely secondary to cocaine and ecstasy use.  Received hydration and subsequent CK level today at 3331.   Patient has about 500 mL urinary output.  Creatinine today at 1.3.  Patient would benefit from ongoing IV fluids and monitoring of his CK levels.  At this time has opted to leave AGAINST MEDICAL ADVICE.   Shakiness Appears to be  related to substance abuse.  Does not appear to be seizures.     AKI (acute kidney injury) (HCC) Creatinine 1.48 on presentation, baseline unknown.  Creatinine today at 1.3.  Patient received some IV fluids.  Currently leaving AGAINST MEDICAL ADVICE.    Alcohol abuse Polysubstance abuse cocaine cannabis ecstasy. Counseling done.   Disposition.  Left AGAINST MEDICAL ADVICE.  Medical Consultants:   None.  Procedures:    None Subjective:   Today, patient was seen and examined at bedside.  Ready and dressed to leave the hospital.  Did explain to him the need for further IV fluids and monitoring closely but does not wish to do that despite understand the risk.  Discharge Exam:   Vitals:   12/07/22 0509 12/07/22 0751  BP: 114/72 125/74  Pulse: 74 71  Resp: 20 16  Temp: 98.6 F (37 C) 97.8 F (36.6 C)  SpO2: 100% 100%   Vitals:   12/06/22 2154 12/06/22 2314 12/07/22 0509 12/07/22 0751  BP: 132/88 110/73 114/72 125/74  Pulse: 88 69 74 71  Resp: 18 18 20 16   Temp: 98.4 F (36.9 C) 98.4 F (36.9 C) 98.6 F (37 C) 97.8 F (36.6 C)  TempSrc: Oral Oral Oral Oral  SpO2: 98% 100% 100% 100%  Weight:  80.1 kg    Height:  5\' 6"  (1.676 m)     Not examined  The results of significant diagnostics from this hospitalization (including imaging, microbiology, ancillary and laboratory) are listed below for reference.     Diagnostic Studies:   CT HEAD WO CONTRAST ( )  Result Date:  12/06/2022 CLINICAL DATA:  Status post seizure. EXAM: CT HEAD WITHOUT CONTRAST TECHNIQUE: Contiguous axial images were obtained from the base of the skull through the vertex without intravenous contrast. RADIATION DOSE REDUCTION: This exam was performed according to the departmental dose-optimization program which includes automated exposure control, adjustment of the mA and/or kV according to patient size and/or use of iterative reconstruction technique. COMPARISON:  September 14, 2022 FINDINGS: Brain: No  evidence of acute infarction, hemorrhage, hydrocephalus, extra-axial collection or mass lesion/mass effect. Vascular: No hyperdense vessel or unexpected calcification. Skull: Normal. Negative for fracture or focal lesion. Sinuses/Orbits: No acute finding. Other: None. IMPRESSION: Normal head CT. Electronically Signed   By: Aram Candela M.D.   On: 12/06/2022 18:27     Labs:   Basic Metabolic Panel: Recent Labs  Lab 12/06/22 1805 12/07/22 0724  NA 136 135  K 4.2 4.0  CL 101 108  CO2 23 18*  GLUCOSE 97 88  BUN 22* 16  CREATININE 1.48* 1.36*  CALCIUM 9.7 8.4*   GFR Estimated Creatinine Clearance: 75.4 mL/min (A) (by C-G formula based on SCr of 1.36 mg/dL (H)). Liver Function Tests: No results for input(s): "AST", "ALT", "ALKPHOS", "BILITOT", "PROT", "ALBUMIN" in the last 168 hours. No results for input(s): "LIPASE", "AMYLASE" in the last 168 hours. No results for input(s): "AMMONIA" in the last 168 hours. Coagulation profile No results for input(s): "INR", "PROTIME" in the last 168 hours.  CBC: Recent Labs  Lab 12/06/22 1805  WBC 10.8*  HGB 15.2  HCT 44.9  MCV 86.8  PLT 349   Cardiac Enzymes: Recent Labs  Lab 12/06/22 1805 12/07/22 0724  CKTOTAL 4,877* 3,331*   BNP: Invalid input(s): "POCBNP" CBG: No results for input(s): "GLUCAP" in the last 168 hours. D-Dimer No results for input(s): "DDIMER" in the last 72 hours. Hgb A1c No results for input(s): "HGBA1C" in the last 72 hours. Lipid Profile No results for input(s): "CHOL", "HDL", "LDLCALC", "TRIG", "CHOLHDL", "LDLDIRECT" in the last 72 hours. Thyroid function studies No results for input(s): "TSH", "T4TOTAL", "T3FREE", "THYROIDAB" in the last 72 hours.  Invalid input(s): "FREET3" Anemia work up No results for input(s): "VITAMINB12", "FOLATE", "FERRITIN", "TIBC", "IRON", "RETICCTPCT" in the last 72 hours. Microbiology No results found for this or any previous visit (from the past 240  hour(s)).   Discharge Instructions:    Did not stay for medication reconciliation.     Signed:  Janecia Palau  [facility] 12/07/2022, 2:35 PM

## 2022-12-08 ENCOUNTER — Observation Stay (HOSPITAL_BASED_OUTPATIENT_CLINIC_OR_DEPARTMENT_OTHER)
Admission: EM | Admit: 2022-12-08 | Discharge: 2022-12-08 | Disposition: A | Discharge status: Home / Self Care | Payer: MEDICAID | Source: Home / Self Care | Attending: Emergency Medicine | Admitting: Emergency Medicine

## 2022-12-08 DIAGNOSIS — F1910 Other psychoactive substance abuse, uncomplicated: Secondary | ICD-10-CM | POA: Diagnosis present

## 2022-12-08 DIAGNOSIS — R251 Tremor, unspecified: Secondary | ICD-10-CM | POA: Diagnosis present

## 2022-12-08 DIAGNOSIS — M6282 Rhabdomyolysis: Secondary | ICD-10-CM | POA: Diagnosis not present

## 2022-12-08 DIAGNOSIS — N179 Acute kidney failure, unspecified: Secondary | ICD-10-CM | POA: Diagnosis not present

## 2022-12-08 LAB — COMPREHENSIVE METABOLIC PANEL
ALT: 42 U/L (ref 0–44)
AST: 110 U/L — ABNORMAL HIGH (ref 15–41)
Albumin: 4.2 g/dL (ref 3.5–5.0)
Alkaline Phosphatase: 74 U/L (ref 38–126)
Anion gap: 8 (ref 5–15)
BUN: 18 mg/dL (ref 6–20)
CO2: 22 mmol/L (ref 22–32)
Calcium: 9.1 mg/dL (ref 8.9–10.3)
Chloride: 105 mmol/L (ref 98–111)
Creatinine, Ser: 1.31 mg/dL — ABNORMAL HIGH (ref 0.61–1.24)
GFR, Estimated: >60 mL/min (ref >60)
Glucose, Bld: 114 mg/dL — ABNORMAL HIGH (ref 70–99)
Potassium: 3.8 mmol/L (ref 3.5–5.1)
Sodium: 135 mmol/L (ref 135–145)
Total Bilirubin: 1.3 mg/dL — ABNORMAL HIGH (ref 0.3–1.2)
Total Protein: 7.5 g/dL (ref 6.5–8.1)

## 2022-12-08 LAB — CBC WITH DIFFERENTIAL/PLATELET
Abs Immature Granulocytes: 0.02 10*3/uL (ref 0.00–0.07)
Basophils Absolute: 0.1 10*3/uL (ref 0.0–0.1)
Basophils Relative: 1 %
Eosinophils Absolute: 0.3 10*3/uL (ref 0.0–0.5)
Eosinophils Relative: 3 %
HCT: 42.8 % (ref 39.0–52.0)
Hemoglobin: 14.2 g/dL (ref 13.0–17.0)
Immature Granulocytes: 0 %
Lymphocytes Relative: 34 %
Lymphs Abs: 2.9 10*3/uL (ref 0.7–4.0)
MCH: 29.2 pg (ref 26.0–34.0)
MCHC: 33.2 g/dL (ref 30.0–36.0)
MCV: 87.9 fL (ref 80.0–100.0)
Monocytes Absolute: 0.8 10*3/uL (ref 0.1–1.0)
Monocytes Relative: 9 %
Neutro Abs: 4.6 10*3/uL (ref 1.7–7.7)
Neutrophils Relative %: 53 %
Platelets: 306 10*3/uL (ref 150–400)
RBC: 4.87 MIL/uL (ref 4.22–5.81)
RDW: 14.6 % (ref 11.5–15.5)
WBC: 8.5 10*3/uL (ref 4.0–10.5)
nRBC: 0 % (ref 0.0–0.2)

## 2022-12-08 LAB — CK
Total CK: 4877 U/L — ABNORMAL HIGH (ref 49–397)
Total CK: 3469 U/L — ABNORMAL HIGH (ref 49–397)

## 2022-12-08 LAB — URINALYSIS, ROUTINE W REFLEX MICROSCOPIC
Bilirubin Urine: NEGATIVE
Glucose, UA: NEGATIVE mg/dL
Hgb urine dipstick: NEGATIVE
Ketones, ur: NEGATIVE mg/dL
Leukocytes,Ua: NEGATIVE
Nitrite: NEGATIVE
Protein, ur: NEGATIVE mg/dL
Specific Gravity, Urine: 1.019 (ref 1.005–1.030)
pH: 5 (ref 5.0–8.0)

## 2022-12-08 MED ORDER — ENOXAPARIN SODIUM 40 MG/0.4ML IJ SOSY: 40.0000 mg | PREFILLED_SYRINGE | INTRAMUSCULAR | Status: DC

## 2022-12-08 MED ORDER — ONDANSETRON HCL 4 MG PO TABS: 4.0000 mg | ORAL_TABLET | Freq: Four times a day (QID) | ORAL | Status: DC | PRN

## 2022-12-08 MED ORDER — ONDANSETRON HCL 4 MG/2ML IJ SOLN
4.0000 mg | Freq: Once | INTRAMUSCULAR | Status: AC
  Administered 2022-12-08: 4 mg via INTRAVENOUS
  Filled 2022-12-08: qty 2

## 2022-12-08 MED ORDER — LORAZEPAM 0.5 MG PO TABS: 0.5000 mg | ORAL_TABLET | ORAL | Status: DC | PRN

## 2022-12-08 MED ORDER — HYDROMORPHONE HCL 1 MG/ML IJ SOLN
1.0000 mg | Freq: Once | INTRAMUSCULAR | Status: AC
  Administered 2022-12-08: 1 mg via INTRAVENOUS
  Filled 2022-12-08: qty 1

## 2022-12-08 MED ORDER — LORAZEPAM 2 MG/ML IJ SOLN
1.0000 mg | INTRAMUSCULAR | Status: DC | PRN
  Filled 2022-12-08: qty 1

## 2022-12-08 MED ORDER — SODIUM CHLORIDE 0.9 % IV SOLN: INTRAVENOUS | Status: DC

## 2022-12-08 MED ORDER — ACETAMINOPHEN 650 MG RE SUPP: 650.0000 mg | Freq: Four times a day (QID) | RECTAL | Status: DC | PRN

## 2022-12-08 MED ORDER — ONDANSETRON HCL 4 MG/2ML IJ SOLN: 4.0000 mg | Freq: Three times a day (TID) | INTRAMUSCULAR | Status: DC | PRN

## 2022-12-08 MED ORDER — ADULT MULTIVITAMIN W/MINERALS CH: 1.0000 | ORAL_TABLET | Freq: Every day | ORAL | Status: DC

## 2022-12-08 MED ORDER — HYDROCODONE-ACETAMINOPHEN 5-325 MG PO TABS
1.0000 | ORAL_TABLET | ORAL | Status: DC | PRN
  Administered 2022-12-08 (×2): 2 via ORAL
  Filled 2022-12-08 (×2): qty 2

## 2022-12-08 MED ORDER — SODIUM CHLORIDE 0.9 % IV BOLUS (SEPSIS)
1000.0000 mL | Freq: Once | INTRAVENOUS | Status: AC
  Administered 2022-12-08: 1000 mL via INTRAVENOUS

## 2022-12-08 MED ORDER — FOLIC ACID 1 MG PO TABS: 1.0000 mg | ORAL_TABLET | Freq: Every day | ORAL | Status: DC

## 2022-12-08 MED ORDER — MORPHINE SULFATE (PF) 2 MG/ML IV SOLN: 2.0000 mg | INTRAVENOUS | Status: DC | PRN

## 2022-12-08 MED ORDER — LORAZEPAM 1 MG PO TABS
1.0000 mg | ORAL_TABLET | ORAL | Status: DC | PRN
  Administered 2022-12-08: 1 mg via ORAL

## 2022-12-08 MED ORDER — THIAMINE HCL 100 MG/ML IJ SOLN: 100.0000 mg | Freq: Every day | INTRAMUSCULAR | Status: DC

## 2022-12-08 MED ORDER — THIAMINE MONONITRATE 100 MG PO TABS: 100.0000 mg | ORAL_TABLET | Freq: Every day | ORAL | Status: DC

## 2022-12-08 MED ORDER — ACETAMINOPHEN 325 MG PO TABS: 650.0000 mg | ORAL_TABLET | Freq: Four times a day (QID) | ORAL | Status: DC | PRN

## 2022-12-08 MED ORDER — LORAZEPAM 2 MG/ML IJ SOLN: 1.0000 mg | Freq: Once | INTRAMUSCULAR | Status: DC

## 2022-12-08 MED ORDER — ONDANSETRON HCL 4 MG/2ML IJ SOLN: 4.0000 mg | Freq: Four times a day (QID) | INTRAMUSCULAR | Status: DC | PRN

## 2022-12-08 NOTE — Assessment & Plan Note (Signed)
Creatinine 1.3, GFR greater than 60 Unclear general at baseline Monitor with hydration Follow

## 2022-12-08 NOTE — Progress Notes (Addendum)
Pt verbalized that he wants to leave AMA; verbally asked pt if he knew what he was doing, he replied "yes"; read to the pt [facility] [employer] wording [phone] (09/15); pt signed form at 1703; the pt verbalized that he wanted to file a grievence because we did not know what we were doing, I advised him to contact Patient Experience on Tuesday, 12/11/22; IV site removed and telemetry discontinued; pt ambulated out of Room 121a

## 2022-12-08 NOTE — Progress Notes (Signed)
Admission profile not completed due to patient's decreased cooperation level and leaving against medical advise.

## 2022-12-08 NOTE — Assessment & Plan Note (Signed)
+   generalized shakiness associated w/ polysubstance use  ? Transient loss of consciousness and urinary incontinence  Will check EEG x 1  8/29 CT head WNL  Nonfocal neuro exam  Follow

## 2022-12-08 NOTE — Assessment & Plan Note (Signed)
Elevated CK in the setting of polysubstance abuse including ecstasy, cocaine, alcohol Left hospital AGAINST MEDICAL ADVICE yesterday CK elevated still persisting into the 4000's Is agreeable to admission Aggressive IV fluid hydration Trend CK Discussed substance abuse cessation Monitor

## 2022-12-08 NOTE — ED Notes (Signed)
Advised nurse that patient has ready bed 

## 2022-12-08 NOTE — Assessment & Plan Note (Addendum)
Positive polysubstance abuse including ecstasy, cocaine, alcohol UDS 8/29 positive for amphetamines, cocaine, marijuana Withdrawal protocol as appropriate Monitor

## 2022-12-08 NOTE — H&P (Addendum)
History and Physical    Patient: Bryan Walters:[id number] DOB: Jan 05, 1986 DOA: 12/08/2022 DOS: the patient was seen and examined on 12/08/2022 [facility]: [facility]  Patient coming from: Home  Chief Complaint:  Chief Complaint  Patient presents with   Generalized Body Aches   HPI: Bryan Walters is a 37 y.o. male with medical history significant of polysubstance use presenting with rhabdomyolysis.  Patient noted to have been admitted yesterday for similar issues.  Patient left AGAINST MEDICAL ADVICE.  Patient states he still has significant generalized body aches and pains.  Noted to have taken ecstasy, cocaine and alcohol as well as marijuana.  Had some generalized shaking associated with these.?  Transient consciousness loss and bladder incontinence per the patient.  No prior history of seizures.  No chest pain or shortness of breath.  No nausea or vomiting.  Patient states he left the hospital AGAINST MEDICAL ADVICE because he went to buy his 81-month-old son.  Shoes?  No abdominal pain.  No paresthesias. Presented to the ER afebrile, hemodynamically stable.  Satting well on room air.  White count 8.5, hemoglobin 14, urinalysis stable, creatinine 1.3.  AST 110, T. bili 1.3. Review of Systems: As mentioned in the history of present illness. All other systems reviewed and are negative. Past Medical History:  Diagnosis Date   Cocaine abuse (HCC)    reported by patient   Panic attacks    reported by patient   Past Surgical History:  Procedure Laterality Date   REFRACTIVE SURGERY     Social History:  reports that he has been smoking cigarettes. He has never used smokeless tobacco. He reports current alcohol use. He reports current drug use. Drugs: Marijuana and Cocaine.  Allergies  Allergen Reactions   Aspirin     No family history on file.  Prior to Admission medications   Not on File    Physical Exam: Vitals:   12/08/22 0700 12/08/22 0748  12/08/22 0800 12/08/22 0818  BP:      Pulse: (!) 48 (!) 46 (!) 53 (!) 46  Resp:      Temp:      TempSrc:      SpO2: 100% 100% 100% 100%  Weight:      Height:       Physical Exam Constitutional:      Appearance: He is normal weight.  HENT:     Head: Normocephalic and atraumatic.     Nose: Nose normal.  Eyes:     Extraocular Movements: Extraocular movements intact.     Pupils: Pupils are equal, round, and reactive to light.  Cardiovascular:     Rate and Rhythm: Normal rate and regular rhythm.  Pulmonary:     Effort: Pulmonary effort is normal.     Breath sounds: Normal breath sounds.  Abdominal:     General: Abdomen is flat. Bowel sounds are normal.  Musculoskeletal:        General: Normal range of motion.     Cervical back: Normal range of motion.  Skin:    General: Skin is warm and dry.  Neurological:     General: No focal deficit present.  Psychiatric:        Mood and Affect: Mood normal.     Data Reviewed:  There are no new results to review at this time.  Assessment and Plan: Non-traumatic rhabdomyolysis Elevated CK in the setting of polysubstance abuse including ecstasy, cocaine, alcohol Left hospital AGAINST MEDICAL ADVICE yesterday CK  elevated still persisting into the 4000's Is agreeable to admission Aggressive IV fluid hydration Trend CK Discussed substance abuse cessation Monitor  Shakiness + generalized shakiness associated w/ polysubstance use  ? Transient loss of consciousness and urinary incontinence  Will check EEG x 1  8/29 CT head WNL  Nonfocal neuro exam  Follow   AKI (acute kidney injury) (HCC) Creatinine 1.3, GFR greater than 60 Unclear general at baseline Monitor with hydration Follow  Polysubstance abuse (HCC) Positive polysubstance abuse including ecstasy, cocaine, alcohol UDS 8/29 positive for amphetamines, cocaine, marijuana Withdrawal protocol as appropriate Monitor   Greater than 50% was spent in counseling and  coordination of care with patient Total encounter time 80 minutes or more    Advance Care Planning:   Code Status: Full Code   Consults: None   Family Communication: No family at the bedside   Severity of Illness: The appropriate patient status for this patient is OBSERVATION. Observation status is judged to be reasonable and necessary in order to provide the required intensity of service to ensure the patient's safety. The patient's presenting symptoms, physical exam findings, and initial radiographic and laboratory data in the context of their medical condition is felt to place them at decreased risk for further clinical deterioration. Furthermore, it is anticipated that the patient will be medically stable for discharge from the hospital within 2 midnights of admission.   Author: Floydene Flock, MD 12/08/2022 8:50 AM  For on call review [email or web address].

## 2022-12-08 NOTE — ED Provider Notes (Signed)
[facility] Provider Note    Event Date/Time   First MD Initiated Contact with Patient 12/08/22 [phone]     (approximate)   History   Generalized Body Aches   HPI  Bryan Walters is a 37 y.o. male with history of cocaine abuse who presents to the emergency department with complaints of diffuse bodyaches.  Was diagnosed with rhabdomyolysis and admitted to the hospital yesterday but then left AGAINST MEDICAL ADVICE.  Has been trying to increase his fluid intake at home.  Denies any vomiting or diarrhea.   History provided by patient, mother by phone.    Past Medical History:  Diagnosis Date   Cocaine abuse (HCC)    reported by patient   Panic attacks    reported by patient    Past Surgical History:  Procedure Laterality Date   REFRACTIVE SURGERY      MEDICATIONS:  Prior to Admission medications   Not on File    Physical Exam   Triage Vital Signs: ED Triage Vitals [12/07/22 2310]  Encounter Vitals Group     BP 127/84     Systolic BP Percentile      Diastolic BP Percentile      Pulse Rate 90     Resp 16     Temp 98.6 F (37 C)     Temp Source Oral     SpO2 95 %     Weight 185 lb (83.9 kg)     Height 5\' 6"  (1.676 m)     Head Circumference      Peak Flow      Pain Score 10     Pain Loc      Pain Education      Exclude from Growth Chart     Most recent vital signs: Vitals:   12/07/22 2310 12/08/22 0236  BP: 127/84 108/68  Pulse: 90 70  Resp: 16 16  Temp: 98.6 F (37 C) 98.3 F (36.8 C)  SpO2: 95% 98%    CONSTITUTIONAL: Alert, responds appropriately to questions.  Appears uncomfortable, tearful HEAD: Normocephalic, atraumatic EYES: Conjunctivae clear, pupils appear equal, sclera nonicteric ENT: normal nose; moist mucous membranes NECK: Supple, normal ROM CARD: RRR; S1 and S2 appreciated RESP: Normal chest excursion without splinting or tachypnea; breath sounds clear and equal bilaterally; no wheezes, no rhonchi, no  rales, no hypoxia or respiratory distress, speaking full sentences ABD/GI: Non-distended; soft, non-tender, no rebound, no guarding, no peritoneal signs BACK: The back appears normal EXT: Normal ROM in all joints; no deformity noted, no edema, compartments soft SKIN: Normal color for age and race; warm; no rash on exposed skin NEURO: Moves all extremities equally, normal speech PSYCH: The patient's mood and manner are appropriate.   ED Results / Procedures / Treatments   LABS: (all labs ordered are listed, but only abnormal results are displayed) Labs Reviewed  COMPREHENSIVE METABOLIC PANEL - Abnormal; Notable for the following components:      Result Value   Glucose, Bld 114 (*)    Creatinine, Ser 1.31 (*)    AST 110 (*)    Total Bilirubin 1.3 (*)    All other components within normal limits  CK - Abnormal; Notable for the following components:   Total CK 4,877 (*)    All other components within normal limits  CBC WITH DIFFERENTIAL/PLATELET  URINALYSIS, ROUTINE W REFLEX MICROSCOPIC     EKG:   RADIOLOGY: My personal review and interpretation of imaging:  I have personally reviewed all radiology reports.   No results found.   PROCEDURES:    Procedures    IMPRESSION / MDM / ASSESSMENT AND PLAN / ED COURSE  I reviewed the triage vital signs and the nursing notes.    Patient here with diffuse body pain in the setting of nontraumatic rhabdomyolysis.     DIFFERENTIAL DIAGNOSIS (includes but not limited to):   Rhabdomyolysis likely secondary to cocaine and ecstasy use recently, dehydration, electrolyte derangement   Patient's presentation is most consistent with acute presentation with potential threat to life or bodily function.   PLAN: Repeat labs in triage show creatinine of 1.31.  AST minimally elevated.  CK is 4877.  This is the same level he was admitted for yesterday.  Will aggressively hydrate.  Will give Dilaudid for pain control.  Will discuss with  the hospitalist for admission.   MEDICATIONS GIVEN IN ED: Medications  0.9 %  sodium chloride infusion ( Intravenous New Bag/Given 12/08/22 0231)  sodium chloride 0.9 % bolus 1,000 mL (0 mLs Intravenous Stopped 12/08/22 0039)  sodium chloride 0.9 % bolus 1,000 mL (0 mLs Intravenous Stopped 12/08/22 0230)  HYDROmorphone (DILAUDID) injection 1 mg (1 mg Intravenous Given 12/08/22 0231)  ondansetron (ZOFRAN) injection 4 mg (4 mg Intravenous Given 12/08/22 0231)     ED COURSE:  Consulted and discussed patient's case with hospitalist, Dr. Para March.  I have recommended admission and consulting physician agrees and will place admission orders.  Patient (and family if present) agree with this plan.   I reviewed all nursing notes, vitals, pertinent previous records.  All labs, EKGs, imaging ordered have been independently reviewed and interpreted by myself.    CONSULTS: Hospitalist consulted for admission for rhabdo.   OUTSIDE RECORDS REVIEWED: Reviewed admission notes from 12/06/2022.       FINAL CLINICAL IMPRESSION(S) / ED DIAGNOSES   Final diagnoses:  Non-traumatic rhabdomyolysis     Rx / DC Orders   ED Discharge Orders     None        Note:  This document was prepared using Dragon voice recognition software and may include unintentional dictation errors.   Alysse Rathe, Layla Maw, DO 12/08/22 [phone]

## 2022-12-19 NOTE — Discharge Summary (Signed)
Physician Discharge Summary   Patient: Bryan Walters MRN: [id number] DOB: 05-Oct-1985  Admit date:     12/08/2022  Discharge date: 12/08/2022  Discharge Physician: Floydene Flock   [facility]P: [facility]   Recommendations at discharge:    Patient admitted for rhabdomyolysis and polysubstance abuse. Pt left the hospital AGAINST MEDICAL ADVICE prior to proper intake.   Discharge Diagnoses: Principal Problem:   Rhabdomyolysis Active Problems:   Non-traumatic rhabdomyolysis   AKI (acute kidney injury) (HCC)   Shakiness   Polysubstance abuse (HCC)  Resolved Problems:   * No resolved hospital problems. *  Hospital Course: No notes on file  Assessment and Plan: Non-traumatic rhabdomyolysis Elevated CK in the setting of polysubstance abuse including ecstasy, cocaine, alcohol Left hospital AGAINST MEDICAL ADVICE yesterday CK elevated still persisting into the 4000's Is agreeable to admission Aggressive IV fluid hydration Trend CK Discussed substance abuse cessation Monitor  Shakiness + generalized shakiness associated w/ polysubstance use  ? Transient loss of consciousness and urinary incontinence  Will check EEG x 1  8/29 CT head WNL  Nonfocal neuro exam  Follow   AKI (acute kidney injury) (HCC) Creatinine 1.3, GFR greater than 60 Unclear general at baseline Monitor with hydration Follow  Polysubstance abuse (HCC) Positive polysubstance abuse including ecstasy, cocaine, alcohol UDS 8/29 positive for amphetamines, cocaine, marijuana Withdrawal protocol as appropriate Monitor       Consultants: None  Procedures performed: None   Disposition:  Pt left AMA  Diet recommendation:  Regular diet DISCHARGE MEDICATION: Allergies as of 12/08/2022       Reactions   Aspirin         Medication List    You have not been prescribed any medications.     Discharge Exam: Filed Weights   12/07/22 2310  Weight: 83.9 kg   Pt left AMA  prior to evaluation   Condition at discharge:  unknown   The results of significant diagnostics from this hospitalization (including imaging, microbiology, ancillary and laboratory) are listed below for reference.   Imaging Studies: CT HEAD WO CONTRAST ( )  Result Date: 12/06/2022 CLINICAL DATA:  Status post seizure. EXAM: CT HEAD WITHOUT CONTRAST TECHNIQUE: Contiguous axial images were obtained from the base of the skull through the vertex without intravenous contrast. RADIATION DOSE REDUCTION: This exam was performed according to the departmental dose-optimization program which includes automated exposure control, adjustment of the mA and/or kV according to patient size and/or use of iterative reconstruction technique. COMPARISON:  September 14, 2022 FINDINGS: Brain: No evidence of acute infarction, hemorrhage, hydrocephalus, extra-axial collection or mass lesion/mass effect. Vascular: No hyperdense vessel or unexpected calcification. Skull: Normal. Negative for fracture or focal lesion. Sinuses/Orbits: No acute finding. Other: None. IMPRESSION: Normal head CT. Electronically Signed   By: Aram Candela M.D.   On: 12/06/2022 18:27    Microbiology: Results for orders placed or performed in visit on 07/05/22  Chlamydia/GC NAA, Confirmation     Status: None   Collection Time: 07/05/22  2:00 PM   Specimen: Urine   UR  Result Value Ref Range Status   Chlamydia trachomatis, NAA Negative Negative Final   Neisseria gonorrhoeae, NAA Negative Negative Final  Gonococcus culture     Status: None   Collection Time: 07/05/22  2:15 PM   Specimen: Pharynx; Oral Swab   PH  Result Value Ref Range Status   GC Culture Only Final report  Final    Comment: Specimen stability note: A swab transport (ie.,  ESwab, Amies agar gel) received by the lab more than 24 hours after collection may result in reduced recovery of Neisseria gonorrhoeae (GC). (This is informational only and may not apply to this specimen.)     Result 1 Comment  Final    Comment: No Neisseria gonorrhoeae isolated.    Labs: CBC: No results for input(s): "WBC", "NEUTROABS", "HGB", "HCT", "MCV", "PLT" in the last 168 hours. Basic Metabolic Panel: No results for input(s): "NA", "K", "CL", "CO2", "GLUCOSE", "BUN", "CREATININE", "CALCIUM", "MG", "PHOS" in the last 168 hours. Liver Function Tests: No results for input(s): "AST", "ALT", "ALKPHOS", "BILITOT", "PROT", "ALBUMIN" in the last 168 hours. CBG: No results for input(s): "GLUCAP" in the last 168 hours.  Discharge time spent: less than 30 minutes.  Signed: Floydene Flock, MD [facility] 12/19/2022

## 2022-12-27 ENCOUNTER — Other Ambulatory Visit: Payer: Self-pay

## 2022-12-27 ENCOUNTER — Emergency Department
Admission: EM | Admit: 2022-12-27 | Discharge: 2022-12-27 | Disposition: A | Discharge status: Home / Self Care | Payer: MEDICAID | Attending: Emergency Medicine | Admitting: Emergency Medicine

## 2022-12-27 DIAGNOSIS — R059 Cough, unspecified: Secondary | ICD-10-CM | POA: Diagnosis present

## 2022-12-27 DIAGNOSIS — U071 COVID-19: Secondary | ICD-10-CM | POA: Diagnosis not present

## 2022-12-27 LAB — SARS CORONAVIRUS 2 BY RT PCR: SARS Coronavirus 2 by RT PCR: POSITIVE — AB

## 2022-12-27 MED ORDER — ACETAMINOPHEN 325 MG PO TABS
650.0000 mg | ORAL_TABLET | Freq: Once | ORAL | Status: AC
  Administered 2022-12-27: 650 mg via ORAL
  Filled 2022-12-27: qty 2

## 2022-12-27 MED ORDER — BENZONATATE 100 MG PO CAPS: 100.0000 mg | ORAL_CAPSULE | Freq: Three times a day (TID) | ORAL | 0 refills | Status: AC | PRN

## 2022-12-27 NOTE — Discharge Instructions (Signed)
You were evaluated in the ED today for cough.  COVID test is positive.  Please follow CDC guidelines on quarantine and isolation protocol.  Get rest and stay hydrated.  Take Tylenol as needed for fever and pain.

## 2022-12-27 NOTE — ED Triage Notes (Signed)
Pt comes via [facility] from home  with c/o covid symptoms. Pt states this all started this morning. Pt has family positive for covid.

## 2022-12-27 NOTE — ED Provider Notes (Signed)
[facility] Emergency Department Provider Note     Event Date/Time   First MD Initiated Contact with Patient 12/27/22 1542     (approximate)   History   Cough   HPI  Bryan Walters is a 37 y.o. male with a history of cocaine abuse, and panic attacks presents to the ED with complaint of a dry cough, headache x this morning around 7 AM.  Has taken Tylenol with minimal relief.  Endorses sick contacts.  Patient has family positive for COVID at this time.  Denies nausea or vomiting, chest pain and shortness of breath.    Physical Exam   Triage Vital Signs: ED Triage Vitals  Encounter Vitals Group     BP 12/27/22 1511 122/80     Systolic BP Percentile --      Diastolic BP Percentile --      Pulse Rate 12/27/22 1511 82     Resp --      Temp 12/27/22 1511 (!) 101.1 F (38.4 C)     Temp Source 12/27/22 1511 Oral     SpO2 12/27/22 1511 98 %     Weight 12/27/22 1550 184 lb 15.5 oz (83.9 kg)     Height 12/27/22 1550 5\' 6"  (1.676 m)     Head Circumference --      Peak Flow --      Pain Score 12/27/22 1508 6     Pain Loc --      Pain Education --      Exclude from Growth Chart --     Most recent vital signs: Vitals:   12/27/22 1511  BP: 122/80  Pulse: 82  Temp: (!) 101.1 F (38.4 C)  SpO2: 98%    General Awake, no distress.  HEENT NCAT. PERRL. EOMI. No rhinorrhea. Mucous membranes are moist.  CV:  Good peripheral perfusion.  RRR RESP:  Normal effort.  LCTAB.  No wheezes. ABD:  No distention.    ED Results / Procedures / Treatments   Labs (all labs ordered are listed, but only abnormal results are displayed) Labs Reviewed  SARS CORONAVIRUS 2 BY RT PCR - Abnormal; Notable for the following components:      Result Value   SARS Coronavirus 2 by RT PCR POSITIVE (*)    All other components within normal limits    No results found.  PROCEDURES:  Critical Care performed: No  Procedures   MEDICATIONS ORDERED IN ED: Medications   acetaminophen (TYLENOL) tablet 650 mg (has no administration in time range)     IMPRESSION / MDM / ASSESSMENT AND PLAN / ED COURSE  I reviewed the triage vital signs and the nursing notes.                               37 y.o. male presents to the emergency department for evaluation and treatment of acute cough. See HPI for further details. Vital signs and physical exam are pertinent for fever of 101.1 Tylenol given in ED.   Differential diagnosis includes, but is not limited to URI, COVID, bronchitis  Patient's presentation is most consistent with acute complicated illness / injury requiring diagnostic workup.  Patient is alert and oriented.  He is nontoxic and hemodynamically stable.  He does present febrile with a temperature of 101.1.  Tylenol is given in ED.  COVID PCR is positive.  Patient is advised to follow CDC guidelines on quarantine  and isolation protocol.  ED precautions provided for any worse or new symptoms. Patient verbalizes understanding. All questions and concerns were addressed during ED visit.     FINAL CLINICAL IMPRESSION(S) / ED DIAGNOSES   Final diagnoses:  COVID-19   Rx / DC Orders   ED Discharge Orders          Ordered    benzonatate (TESSALON PERLES) 100 MG capsule  3 times daily PRN        12/27/22 1557            Note:  This document was prepared using Dragon voice recognition software and may include unintentional dictation errors.    Romeo Apple, [place] A, PA-C 12/27/22 2323    Janith Lima, MD 12/28/22 [phone]

## 2023-05-01 ENCOUNTER — Other Ambulatory Visit: Payer: Self-pay

## 2023-05-01 ENCOUNTER — Emergency Department
Admission: EM | Admit: 2023-05-01 | Discharge: 2023-05-01 | Discharge status: Against Medical Advice | Payer: MEDICAID | Attending: Emergency Medicine | Admitting: Emergency Medicine

## 2023-05-01 DIAGNOSIS — Z5321 Procedure and treatment not carried out due to patient leaving prior to being seen by health care provider: Secondary | ICD-10-CM | POA: Diagnosis not present

## 2023-05-01 DIAGNOSIS — M545 Low back pain, unspecified: Secondary | ICD-10-CM | POA: Insufficient documentation

## 2023-05-01 DIAGNOSIS — W19XXXA Unspecified fall, initial encounter: Secondary | ICD-10-CM | POA: Insufficient documentation

## 2023-05-01 NOTE — ED Notes (Signed)
Pt loudly using profanity in lobby while on the phone. Security called to come speak with patient.

## 2023-05-01 NOTE — ED Notes (Signed)
First Nurse Note: Pt to ED via [facility] from home for back pain. Pt reports pain radiates to both sides. Pt states that he was carrying a box and felt like his back gave out and he fell. Pt denies hx/o back problems. Pt used OTC Goody's powder for pain but it he is still rating it 10/10. Vital stable with ems

## 2023-05-01 NOTE — ED Triage Notes (Signed)
Pt reports moving things around his house this morning when his back gave out, causing him to fall. Pt c/o severe lower back pain, no hx of back issues or other medical issues

## 2023-05-01 NOTE — ED Notes (Signed)
Pt visitor to front desk stating that they are going to take pt to another hospital.

## 2023-05-01 NOTE — ED Notes (Signed)
Pt visitor to the front desk to check on wait time. Pt visitor informed we do not give out wait times.

## 2023-05-09 ENCOUNTER — Emergency Department
Admission: EM | Admit: 2023-05-09 | Discharge: 2023-05-09 | Discharge status: Against Medical Advice | Payer: MEDICAID | Attending: Emergency Medicine | Admitting: Emergency Medicine

## 2023-05-09 ENCOUNTER — Other Ambulatory Visit: Payer: Self-pay

## 2023-05-09 DIAGNOSIS — Z20822 Contact with and (suspected) exposure to covid-19: Secondary | ICD-10-CM | POA: Insufficient documentation

## 2023-05-09 DIAGNOSIS — R197 Diarrhea, unspecified: Secondary | ICD-10-CM | POA: Diagnosis not present

## 2023-05-09 DIAGNOSIS — R112 Nausea with vomiting, unspecified: Secondary | ICD-10-CM | POA: Insufficient documentation

## 2023-05-09 DIAGNOSIS — R1011 Right upper quadrant pain: Secondary | ICD-10-CM | POA: Insufficient documentation

## 2023-05-09 DIAGNOSIS — Z5321 Procedure and treatment not carried out due to patient leaving prior to being seen by health care provider: Secondary | ICD-10-CM | POA: Diagnosis not present

## 2023-05-09 LAB — RESP PANEL BY RT-PCR (RSV, FLU A&B, COVID)  RVPGX2
Influenza A by PCR: NEGATIVE
Influenza B by PCR: NEGATIVE
Resp Syncytial Virus by PCR: NEGATIVE
SARS Coronavirus 2 by RT PCR: NEGATIVE

## 2023-05-09 LAB — COMPREHENSIVE METABOLIC PANEL
ALT: 36 U/L (ref 0–44)
AST: 38 U/L (ref 15–41)
Albumin: 4.4 g/dL (ref 3.5–5.0)
Alkaline Phosphatase: 68 U/L (ref 38–126)
Anion gap: 12 (ref 5–15)
BUN: 17 mg/dL (ref 6–20)
CO2: 21 mmol/L — ABNORMAL LOW (ref 22–32)
Calcium: 9.5 mg/dL (ref 8.9–10.3)
Chloride: 104 mmol/L (ref 98–111)
Creatinine, Ser: 1.2 mg/dL (ref 0.61–1.24)
GFR, Estimated: >60 mL/min (ref >60)
Glucose, Bld: 95 mg/dL (ref 70–99)
Potassium: 4 mmol/L (ref 3.5–5.1)
Sodium: 137 mmol/L (ref 135–145)
Total Bilirubin: 1.2 mg/dL (ref 0.0–1.2)
Total Protein: 7.9 g/dL (ref 6.5–8.1)

## 2023-05-09 LAB — URINALYSIS, ROUTINE W REFLEX MICROSCOPIC
Bilirubin Urine: NEGATIVE
Glucose, UA: NEGATIVE mg/dL
Hgb urine dipstick: NEGATIVE
Ketones, ur: NEGATIVE mg/dL
Leukocytes,Ua: NEGATIVE
Nitrite: NEGATIVE
Protein, ur: NEGATIVE mg/dL
Specific Gravity, Urine: 1.025 (ref 1.005–1.030)
pH: 5 (ref 5.0–8.0)

## 2023-05-09 LAB — CBC
HCT: 46.3 % (ref 39.0–52.0)
Hemoglobin: 15.2 g/dL (ref 13.0–17.0)
MCH: 29.3 pg (ref 26.0–34.0)
MCHC: 32.8 g/dL (ref 30.0–36.0)
MCV: 89.2 fL (ref 80.0–100.0)
Platelets: 363 10*3/uL (ref 150–400)
RBC: 5.19 MIL/uL (ref 4.22–5.81)
RDW: 14.2 % (ref 11.5–15.5)
WBC: 6.9 10*3/uL (ref 4.0–10.5)
nRBC: 0 % (ref 0.0–0.2)

## 2023-05-09 LAB — LIPASE, BLOOD: Lipase: 32 U/L (ref 11–51)

## 2023-05-09 NOTE — ED Provider Triage Note (Signed)
Emergency Medicine Provider Triage Evaluation Note  Bryan Walters , a 38 y.o. male  was evaluated in triage.  Pt complains of abd pain with nausea, vomting and diarrhea.  RUQ pain.  Review of Systems  Positive: + chills Negative:   Physical Exam  BP (!) 139/104 (BP Location: Left Arm)   Pulse 64   Temp 98.6 F (37 C) (Oral)   Resp 16   Wt 81.6 kg   SpO2 100%   BMI 29.04 kg/m  Gen:   Awake, no distress   Resp:  Normal effort  MSK:   Moves extremities without difficulty  Other:    Medical Decision Making  Medically screening exam initiated at 9:26 AM.  Appropriate orders placed.  Bryan Walters was informed that the remainder of the evaluation will be completed by another provider, this initial triage assessment does not replace that evaluation, and the importance of remaining in the ED until their evaluation is complete.     Bryan Rumps, PA-C 05/09/23 [phone]

## 2023-05-09 NOTE — ED Triage Notes (Signed)
Also c/o chills

## 2023-05-09 NOTE — ED Triage Notes (Signed)
Arrives from home via [facility] C/O RUQ abd pain since last night.  Also vomiting and diarrhea with pain.  VS wnl  177/101  Ambulatory on scene.

## 2023-06-01 ENCOUNTER — Emergency Department
Admission: EM | Admit: 2023-06-01 | Discharge: 2023-06-01 | Disposition: A | Discharge status: Against Medical Advice | Payer: MEDICAID | Attending: Emergency Medicine | Admitting: Emergency Medicine

## 2023-06-01 ENCOUNTER — Other Ambulatory Visit: Payer: Self-pay

## 2023-06-01 DIAGNOSIS — M542 Cervicalgia: Secondary | ICD-10-CM | POA: Insufficient documentation

## 2023-06-01 DIAGNOSIS — Z5321 Procedure and treatment not carried out due to patient leaving prior to being seen by health care provider: Secondary | ICD-10-CM | POA: Diagnosis not present

## 2023-06-01 NOTE — ED Triage Notes (Signed)
 Pt reports severe neck pain that started last night and is not able to move his neck at all. Pt denies any medical hx or medication use
